# Patient Record
Sex: Female | Born: 1955
Health system: Southern US, Community
[De-identification: ages and names within clinical notes are randomized; demographics above are authoritative.]

## PROBLEM LIST (undated history)

## (undated) DIAGNOSIS — M1990 Unspecified osteoarthritis, unspecified site: Secondary | ICD-10-CM

## (undated) DIAGNOSIS — C801 Malignant (primary) neoplasm, unspecified: Secondary | ICD-10-CM

## (undated) DIAGNOSIS — I209 Angina pectoris, unspecified: Secondary | ICD-10-CM

## (undated) DIAGNOSIS — F419 Anxiety disorder, unspecified: Secondary | ICD-10-CM

## (undated) DIAGNOSIS — E079 Disorder of thyroid, unspecified: Secondary | ICD-10-CM

## (undated) HISTORY — DX: Unspecified osteoarthritis, unspecified site: M19.90

## (undated) HISTORY — PX: TONSILLECTOMY: SUR1361

## (undated) HISTORY — DX: Disorder of thyroid, unspecified: E07.9

## (undated) HISTORY — PX: DILATION AND CURETTAGE OF UTERUS: SHX78

---

## 1998-06-11 ENCOUNTER — Other Ambulatory Visit: Admission: RE | Admit: 1998-06-11 | Discharge: 1998-06-11 | Payer: Self-pay | Admitting: Obstetrics and Gynecology

## 2000-01-03 ENCOUNTER — Other Ambulatory Visit: Admission: RE | Admit: 2000-01-03 | Discharge: 2000-01-03 | Payer: Self-pay | Admitting: Obstetrics and Gynecology

## 2001-02-02 ENCOUNTER — Other Ambulatory Visit: Admission: RE | Admit: 2001-02-02 | Discharge: 2001-02-02 | Payer: Self-pay | Admitting: Obstetrics and Gynecology

## 2001-03-28 ENCOUNTER — Ambulatory Visit (HOSPITAL_COMMUNITY): Admission: RE | Admit: 2001-03-28 | Discharge: 2001-03-28 | Payer: Self-pay | Admitting: Obstetrics and Gynecology

## 2003-09-02 ENCOUNTER — Other Ambulatory Visit: Admission: RE | Admit: 2003-09-02 | Discharge: 2003-09-02 | Payer: Self-pay | Admitting: Obstetrics & Gynecology

## 2013-03-14 LAB — HM DIABETES EYE EXAM

## 2013-04-14 LAB — HM MAMMOGRAPHY

## 2013-05-23 ENCOUNTER — Encounter: Payer: Self-pay | Admitting: Internal Medicine

## 2013-05-23 ENCOUNTER — Ambulatory Visit (INDEPENDENT_AMBULATORY_CARE_PROVIDER_SITE_OTHER)
Admission: RE | Admit: 2013-05-23 | Discharge: 2013-05-23 | Disposition: A | Discharge status: Home / Self Care | Payer: BC Managed Care – PPO | Source: Ambulatory Visit | Attending: Internal Medicine | Admitting: Internal Medicine

## 2013-05-23 ENCOUNTER — Ambulatory Visit (INDEPENDENT_AMBULATORY_CARE_PROVIDER_SITE_OTHER): Payer: BC Managed Care – PPO | Admitting: Internal Medicine

## 2013-05-23 VITALS — BP 112/72 | HR 66 | Temp 98.5°F | Resp 16 | Ht 65.0 in

## 2013-05-23 DIAGNOSIS — M25561 Pain in right knee: Secondary | ICD-10-CM

## 2013-05-23 DIAGNOSIS — M179 Osteoarthritis of knee, unspecified: Secondary | ICD-10-CM

## 2013-05-23 DIAGNOSIS — M25569 Pain in unspecified knee: Secondary | ICD-10-CM

## 2013-05-23 DIAGNOSIS — M1710 Unilateral primary osteoarthritis, unspecified knee: Secondary | ICD-10-CM

## 2013-05-23 HISTORY — DX: Osteoarthritis of knee, unspecified: M17.9

## 2013-05-23 MED ORDER — NAPROXEN-ESOMEPRAZOLE 500-20 MG PO TBEC: 1.0000 | DELAYED_RELEASE_TABLET | Freq: Two times a day (BID) | ORAL | Status: DC

## 2013-05-23 NOTE — Assessment & Plan Note (Signed)
Start nsaids and PT Refer to ortho for treatment options regarding the calcific tendonitis of the MCL

## 2013-05-23 NOTE — Assessment & Plan Note (Signed)
Start Vimovo and begin PT

## 2013-05-23 NOTE — Patient Instructions (Signed)

## 2013-05-23 NOTE — Progress Notes (Signed)
  Subjective:    Patient ID: Toni Ramirez, female    DOB: 11-12-1956, 57 y.o.   MRN: [id number]  Arthritis Presents for initial visit. The disease course has been worsening. She complains of pain. She reports no stiffness, joint swelling or joint warmth. Affected locations include the right knee. Her pain is at a severity of 3/10. Pertinent negatives include no diarrhea, dry eyes, dry mouth, dysuria, fatigue, fever, pain at night, pain while resting, rash, Raynaud's syndrome, uveitis or weight loss. Her past medical history is significant for osteoarthritis. Her pertinent risk factors include overuse. Past treatments include NSAIDs. The treatment provided mild relief. Factors aggravating her arthritis include activity. Compliance with prior treatments has been good.      Review of Systems  Constitutional: Negative.  Negative for fever, weight loss and fatigue.  HENT: Negative.   Eyes: Negative.   Respiratory: Negative.   Cardiovascular: Negative.   Gastrointestinal: Negative.  Negative for diarrhea.  Endocrine: Negative.   Genitourinary: Negative.  Negative for dysuria.  Musculoskeletal: Positive for arthritis. Negative for myalgias, back pain, joint swelling, gait problem and stiffness.  Skin: Negative for rash.  Allergic/Immunologic: Negative.   Neurological: Negative.   Hematological: Negative.   Psychiatric/Behavioral: Negative.   All other systems reviewed and are negative.       Objective:   Physical Exam  Vitals reviewed. Constitutional: She is oriented to person, place, and time. She appears well-developed and well-nourished. No distress.  HENT:  Head: Normocephalic and atraumatic.  Mouth/Throat: Oropharynx is clear and moist. No oropharyngeal exudate.  Eyes: Conjunctivae are normal. Right eye exhibits no discharge. Left eye exhibits no discharge. No scleral icterus.  Neck: Normal range of motion. Neck supple. No JVD present. No tracheal deviation present. No thyromegaly  present.  Cardiovascular: Normal rate, regular rhythm, normal heart sounds and intact distal pulses.  Exam reveals no gallop and no friction rub.   No murmur heard. Pulmonary/Chest: Effort normal and breath sounds normal. No stridor. No respiratory distress. She has no wheezes. She has no rales. She exhibits no tenderness.  Abdominal: Soft. Bowel sounds are normal. She exhibits no distension and no mass. There is no tenderness. There is no rebound and no guarding.  Musculoskeletal: Normal range of motion. She exhibits no edema and no tenderness.       Right knee: She exhibits deformity (mild crepitance). She exhibits normal range of motion, no swelling, no effusion, no ecchymosis, no laceration, no erythema, normal alignment, no LCL laxity, normal patellar mobility and no bony tenderness. No tenderness found.       Left knee: Normal. She exhibits normal range of motion, no swelling, no effusion, no ecchymosis, no deformity, no laceration, no erythema, normal alignment, no LCL laxity, normal patellar mobility and no bony tenderness. No tenderness found.  Lymphadenopathy:    She has no cervical adenopathy.  Neurological: She is oriented to person, place, and time.  Skin: Skin is warm and dry. No rash noted. She is not diaphoretic. No erythema. No pallor.  Psychiatric: She has a normal mood and affect. Her behavior is normal. Judgment and thought content normal.     No results found for this basename: WBC, HGB, HCT, PLT, GLUCOSE, CHOL, TRIG, HDL, LDLDIRECT, LDLCALC, ALT, AST, NA, K, CL, CREATININE, BUN, CO2, TSH, PSA, INR, GLUF, HGBA1C, MICROALBUR       Assessment & Plan:

## 2013-09-19 ENCOUNTER — Other Ambulatory Visit: Payer: Self-pay

## 2013-11-13 ENCOUNTER — Other Ambulatory Visit (INDEPENDENT_AMBULATORY_CARE_PROVIDER_SITE_OTHER): Payer: BC Managed Care – PPO

## 2013-11-13 ENCOUNTER — Ambulatory Visit (INDEPENDENT_AMBULATORY_CARE_PROVIDER_SITE_OTHER): Payer: BC Managed Care – PPO | Admitting: Internal Medicine

## 2013-11-13 ENCOUNTER — Encounter: Payer: Self-pay | Admitting: Internal Medicine

## 2013-11-13 VITALS — BP 110/90 | HR 80 | Temp 98.9°F | Resp 16 | Wt 194.0 lb

## 2013-11-13 DIAGNOSIS — E049 Nontoxic goiter, unspecified: Secondary | ICD-10-CM | POA: Insufficient documentation

## 2013-11-13 DIAGNOSIS — J069 Acute upper respiratory infection, unspecified: Secondary | ICD-10-CM

## 2013-11-13 LAB — CBC WITH DIFFERENTIAL/PLATELET
Basophils Relative: 0.3 % (ref 0.0–3.0)
Eosinophils Relative: 3 % (ref 0.0–5.0)
HCT: 42.3 % (ref 36.0–46.0)
Lymphs Abs: 2.5 10*3/uL (ref 0.7–4.0)
MCHC: 33.4 g/dL (ref 30.0–36.0)
MCV: 85.9 fl (ref 78.0–100.0)
Monocytes Absolute: 0.7 10*3/uL (ref 0.1–1.0)
RBC: 4.92 Mil/uL (ref 3.87–5.11)
WBC: 7.4 10*3/uL (ref 4.5–10.5)

## 2013-11-13 LAB — BASIC METABOLIC PANEL
BUN: 16 mg/dL (ref 6–23)
Calcium: 9.4 mg/dL (ref 8.4–10.5)
Creatinine, Ser: 0.8 mg/dL (ref 0.4–1.2)
GFR: 80.49 mL/min (ref >60.00)
Glucose, Bld: 111 mg/dL — ABNORMAL HIGH (ref 70–99)

## 2013-11-13 LAB — HEPATIC FUNCTION PANEL
AST: 15 U/L (ref 0–37)
Bilirubin, Direct: 0.1 mg/dL (ref 0.0–0.3)
Total Bilirubin: 0.9 mg/dL (ref 0.3–1.2)

## 2013-11-13 MED ORDER — AZITHROMYCIN 250 MG PO TABS: ORAL_TABLET | ORAL | Status: DC

## 2013-11-13 NOTE — Progress Notes (Signed)
Pre visit review using our clinic review tool, if applicable. No additional management support is needed unless otherwise documented below in the visit note. 

## 2013-11-13 NOTE — Assessment & Plan Note (Signed)
12/14 R>>L [place] TSH, BMET, CBC

## 2013-11-13 NOTE — Progress Notes (Signed)
   Subjective:    Cough This is a new problem. The current episode started 1 to 4 weeks ago (2 wks). Associated symptoms include chills, a fever, nasal congestion and a sore throat. There is no history of asthma.      Review of Systems  Constitutional: Positive for fever and chills.  HENT: Positive for sore throat.   Respiratory: Positive for cough.   Gastrointestinal: Negative for diarrhea.       Objective:   Physical Exam  Constitutional: She appears well-developed. No distress.  HENT:  Head: Normocephalic.  Right Ear: External ear normal.  Left Ear: External ear normal.  Nose: Nose normal.  Mouth/Throat: Oropharynx is clear and moist.  Eyes: Conjunctivae are normal. Pupils are equal, round, and reactive to light. Right eye exhibits no discharge. Left eye exhibits no discharge.  Neck: Normal range of motion. Neck supple. No JVD present. No tracheal deviation present. Thyromegaly (R>>L) present.  Cardiovascular: Normal rate, regular rhythm and normal heart sounds.   Pulmonary/Chest: No stridor. No respiratory distress. She has no wheezes.  Abdominal: Soft. Bowel sounds are normal. She exhibits no distension and no mass. There is no tenderness. There is no rebound and no guarding.  Musculoskeletal: She exhibits no edema and no tenderness.  Lymphadenopathy:    She has no cervical adenopathy.  Neurological: She displays normal reflexes. No cranial nerve deficit. She exhibits normal muscle tone. Coordination normal.  Skin: No rash noted. No erythema.  Psychiatric: She has a normal mood and affect. Her behavior is normal. Judgment and thought content normal.          Assessment & Plan:

## 2013-11-14 ENCOUNTER — Encounter: Payer: Self-pay | Admitting: Internal Medicine

## 2013-11-14 NOTE — Assessment & Plan Note (Signed)
See meds 

## 2013-11-15 ENCOUNTER — Ambulatory Visit
Admission: RE | Admit: 2013-11-15 | Discharge: 2013-11-15 | Disposition: A | Discharge status: Home / Self Care | Payer: BC Managed Care – PPO | Source: Ambulatory Visit | Attending: Internal Medicine | Admitting: Internal Medicine

## 2013-11-15 LAB — TSH: TSH: 0.82 u[IU]/mL (ref 0.35–5.50)

## 2013-11-20 ENCOUNTER — Ambulatory Visit: Payer: BC Managed Care – PPO | Admitting: Internal Medicine

## 2013-11-20 ENCOUNTER — Other Ambulatory Visit (INDEPENDENT_AMBULATORY_CARE_PROVIDER_SITE_OTHER): Payer: BC Managed Care – PPO

## 2013-11-20 ENCOUNTER — Ambulatory Visit (INDEPENDENT_AMBULATORY_CARE_PROVIDER_SITE_OTHER): Payer: BC Managed Care – PPO | Admitting: Internal Medicine

## 2013-11-20 ENCOUNTER — Encounter: Payer: Self-pay | Admitting: Internal Medicine

## 2013-11-20 ENCOUNTER — Ambulatory Visit (INDEPENDENT_AMBULATORY_CARE_PROVIDER_SITE_OTHER)
Admission: RE | Admit: 2013-11-20 | Discharge: 2013-11-20 | Disposition: A | Discharge status: Home / Self Care | Payer: BC Managed Care – PPO | Source: Ambulatory Visit | Attending: Internal Medicine | Admitting: Internal Medicine

## 2013-11-20 VITALS — BP 126/74 | HR 87 | Temp 97.5°F | Resp 16 | Ht 65.0 in | Wt 194.0 lb

## 2013-11-20 DIAGNOSIS — R739 Hyperglycemia, unspecified: Secondary | ICD-10-CM | POA: Insufficient documentation

## 2013-11-20 DIAGNOSIS — R7309 Other abnormal glucose: Secondary | ICD-10-CM

## 2013-11-20 DIAGNOSIS — E049 Nontoxic goiter, unspecified: Secondary | ICD-10-CM

## 2013-11-20 DIAGNOSIS — R059 Cough, unspecified: Secondary | ICD-10-CM

## 2013-11-20 DIAGNOSIS — R05 Cough: Secondary | ICD-10-CM

## 2013-11-20 DIAGNOSIS — E079 Disorder of thyroid, unspecified: Secondary | ICD-10-CM

## 2013-11-20 LAB — BASIC METABOLIC PANEL
BUN: 16 mg/dL (ref 6–23)
CALCIUM: 9.6 mg/dL (ref 8.4–10.5)
CO2: 26 mEq/L (ref 19–32)
Chloride: 104 mEq/L (ref 96–112)
Creatinine, Ser: 0.9 mg/dL (ref 0.4–1.2)
GFR: 70.02 mL/min (ref >60.00)
Glucose, Bld: 104 mg/dL — ABNORMAL HIGH (ref 70–99)
POTASSIUM: 4 meq/L (ref 3.5–5.1)
SODIUM: 139 meq/L (ref 135–145)

## 2013-11-20 LAB — HEMOGLOBIN A1C: HEMOGLOBIN A1C: 5.6 % (ref 4.6–6.5)

## 2013-11-20 LAB — T3, FREE: T3 FREE: 3 pg/mL (ref 2.3–4.2)

## 2013-11-20 MED ORDER — MOMETASONE FUROATE 220 MCG/INH IN AEPB: 2.0000 | INHALATION_SPRAY | Freq: Every day | RESPIRATORY_TRACT | Status: DC

## 2013-11-20 MED ORDER — HYDROCOD POLST-CHLORPHEN POLST 10-8 MG/5ML PO LQCR: 5.0000 mL | Freq: Two times a day (BID) | ORAL | Status: DC | PRN

## 2013-11-20 NOTE — Assessment & Plan Note (Signed)
Labs today to check her TFT's and to look for Grave's disease

## 2013-11-20 NOTE — Progress Notes (Signed)
Subjective:    Patient ID: Toni Ramirez, female    DOB: February 15, 1956, 58 y.o.   MRN: [id number]  Cough This is a recurrent problem. The current episode started 1 to 4 weeks ago. The problem has been gradually improving. The problem occurs every few hours. The cough is non-productive. Pertinent negatives include no chest pain, chills, ear congestion, ear pain, fever, headaches, heartburn, hemoptysis, myalgias, nasal congestion, postnasal drip, rash, rhinorrhea, sore throat, shortness of breath, sweats, weight loss or wheezing. The symptoms are aggravated by cold air. She has tried OTC cough suppressant (zpak) for the symptoms. The treatment provided mild relief. Her past medical history is significant for bronchitis. There is no history of asthma, bronchiectasis, COPD, emphysema, environmental allergies or pneumonia.      Review of Systems  Constitutional: Negative.  Negative for fever, chills, weight loss, diaphoresis, appetite change and fatigue.  HENT: Negative.  Negative for ear pain, postnasal drip, rhinorrhea, sore throat, tinnitus, trouble swallowing and voice change.   Eyes: Negative.   Respiratory: Positive for cough. Negative for apnea, hemoptysis, choking, chest tightness, shortness of breath, wheezing and stridor.   Cardiovascular: Negative.  Negative for chest pain, palpitations and leg swelling.  Gastrointestinal: Negative.  Negative for heartburn, nausea, vomiting, abdominal pain, diarrhea, constipation and blood in stool.  Endocrine: Negative.   Genitourinary: Negative.   Musculoskeletal: Negative.  Negative for myalgias, neck pain and neck stiffness.  Skin: Negative.  Negative for rash.  Allergic/Immunologic: Negative.  Negative for environmental allergies.  Neurological: Negative for headaches.  Hematological: Negative.  Negative for adenopathy. Does not bruise/bleed easily.  Psychiatric/Behavioral: Negative.        Objective:   Physical Exam  Vitals  reviewed. Constitutional: She is oriented to person, place, and time. She appears well-developed and well-nourished.  Non-toxic appearance. She does not have a sickly appearance. She does not appear ill. No distress.  HENT:  Head: Normocephalic and atraumatic.  Mouth/Throat: Oropharynx is clear and moist. No oropharyngeal exudate.  Eyes: Conjunctivae are normal. Right eye exhibits no discharge. Left eye exhibits no discharge. No scleral icterus.  Neck: Normal range of motion. Neck supple. No tracheal tenderness present. No tracheal deviation present. Mass and thyromegaly present.  Cardiovascular: Normal rate, regular rhythm, normal heart sounds and intact distal pulses.  Exam reveals no gallop and no friction rub.   No murmur heard. Pulmonary/Chest: Effort normal and breath sounds normal. No accessory muscle usage or stridor. Not tachypneic. No respiratory distress. She has no decreased breath sounds. She has no wheezes. She has no rhonchi. She has no rales. She exhibits no tenderness.  Abdominal: Soft. Bowel sounds are normal. She exhibits no distension and no mass. There is no tenderness. There is no rebound and no guarding.  Musculoskeletal: Normal range of motion. She exhibits no edema and no tenderness.  Lymphadenopathy:    She has no cervical adenopathy.  Neurological: She is oriented to person, place, and time.  Skin: Skin is warm and dry. No rash noted. She is not diaphoretic. No erythema. No pallor.  Psychiatric: She has a normal mood and affect. Her behavior is normal. Judgment and thought content normal.     Lab Results  Component Value Date   WBC 7.4 11/13/2013   HGB 14.1 11/13/2013   HCT 42.3 11/13/2013   PLT 355.0 11/13/2013   GLUCOSE 111* 11/13/2013   ALT 16 11/13/2013   AST 15 11/13/2013   NA 138 11/13/2013   K 4.1 11/13/2013   CL 106  11/13/2013   CREATININE 0.8 11/13/2013   BUN 16 11/13/2013   CO2 27 11/13/2013   TSH 0.82 11/13/2013       Assessment & Plan:

## 2013-11-20 NOTE — Assessment & Plan Note (Signed)
I will check her CXR to see if there is PNA, mass, edema, etc I think she has a persistent post-viral URI cough so I have asked her to start using asmanex inh and to take tussionex susp as needed for the cough

## 2013-11-20 NOTE — Progress Notes (Signed)
Pre visit review using our clinic review tool, if applicable. No additional management support is needed unless otherwise documented below in the visit note. 

## 2013-11-20 NOTE — Assessment & Plan Note (Signed)
She will see ENT to consider surgery or biopsy Today I will check her labs to look for hyper-hypothyroidism, grave's disease, and cancer

## 2013-11-20 NOTE — Assessment & Plan Note (Signed)
I will check her A1C to see if she has developed DM2 

## 2013-11-20 NOTE — Patient Instructions (Signed)

## 2013-11-21 ENCOUNTER — Encounter: Payer: Self-pay | Admitting: Internal Medicine

## 2013-11-21 LAB — THYROID PEROXIDASE ANTIBODY

## 2013-11-21 LAB — T4: T4 TOTAL: 7.4 ug/dL (ref 5.0–12.5)

## 2013-11-21 LAB — THYROGLOBULIN ANTIBODY: Thyroglobulin Ab: <20 U/mL (ref <40.0)

## 2013-11-22 ENCOUNTER — Other Ambulatory Visit (INDEPENDENT_AMBULATORY_CARE_PROVIDER_SITE_OTHER): Payer: Self-pay

## 2013-11-22 ENCOUNTER — Ambulatory Visit (INDEPENDENT_AMBULATORY_CARE_PROVIDER_SITE_OTHER): Payer: BC Managed Care – PPO | Admitting: Surgery

## 2013-11-22 ENCOUNTER — Encounter (INDEPENDENT_AMBULATORY_CARE_PROVIDER_SITE_OTHER): Payer: Self-pay | Admitting: Surgery

## 2013-11-22 VITALS — BP 132/86 | HR 78 | Resp 18 | Ht 65.75 in | Wt 195.0 lb

## 2013-11-22 DIAGNOSIS — E079 Disorder of thyroid, unspecified: Secondary | ICD-10-CM

## 2013-11-22 NOTE — Progress Notes (Addendum)
Re:   Toni Ramirez DOB:   Jun 15, 1956 MRN:   [id number]  ASSESSMENT AND PLAN: 1.  Thyroid mass, right lobe  7.4 cm on [place]  Will plan needle biopsy of right thyroid mass.  Because of size of the mass, will almost certainly need to remove it.  I gave her literature on thyroid diseases.  I discussed briefly surgery, the indications and risks.  Risks include, but are not limited to bleeding, infection, nerve injury (particularly the recurrent laryngeal nerve), and parathyroid injury. 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  Discussed with patient about going ahead with a right thyroid lobectomy.  DN 12/01/2013]  2.  DJD of knee - doing fairly well. 3.  Has chest cold/bronchitis for about 3 weeks - no fever  Chief Complaint  Patient presents with  . New Evaluation    thy mass   REFERRING PHYSICIAN: Scarlette Calico, MD  HISTORY OF PRESENT ILLNESS: Toni Ramirez is a 58 y.o. (DOB: 11-Mar-1956)  white  female whose primary care physician is Toni Calico, MD and comes to me today for right thyroid mass.  She did not notice anything, but went to Dr. Ronnald Ramp over the holidays for a persistant cold.  He noticed a right thyroid mass and started a work up of the mass.  She has had no prior head or neck irradiation.  She has no family history of thyroid disease.  She's had no prior neck surgery.  [place] - 11/13/2013 - Thyromegaly with dominant 7.4 cm right mass. Findings meet consensus criteria for biopsy. TSH - 0.82 0 11/13/2013   Past Medical History  Diagnosis Date  . Arthritis     Past Surgical History  Procedure Laterality Date  . Tonsillectomy    . Dilation and curettage of uterus  1994, 2003   Current Outpatient Prescriptions  Medication Sig Dispense Refill  . chlorpheniramine-HYDROcodone (TUSSIONEX PENNKINETIC ER) 10-8 MG/5ML LQCR Take 5 mLs by mouth every 12 (twelve) hours as needed for cough.  115 mL  0  . mometasone (ASMANEX 60  METERED DOSES) 220 MCG/INH inhaler Inhale 2 puffs into the lungs daily.  1 Inhaler  0  . Naproxen-Esomeprazole (VIMOVO) 500-20 MG TBEC Take 1 tablet by mouth 2 (two) times daily.  180 tablet  1   No current facility-administered medications for this visit.     No Known Allergies  REVIEW OF SYSTEMS: Skin:  No history of rash.  No history of abnormal moles. Infection:  No history of hepatitis or HIV.  No history of MRSA. Neurologic:  No history of stroke.  No history of headaches. Cardiac:  No history of hypertension. No history of heart disease.   No history of seeing a cardiologist. Pulmonary:  Has 2-3 weeks cold/bronchitis.  Endocrine:  No diabetes. No thyroid disease. Gastrointestinal:  No history of stomach disease.  No history of liver disease.  No history of gall bladder disease.  No history of pancreas disease.  Colonoscopy by Dr. Collene Mares - 2010. Urologic:  No history of kidney stones.  No history of bladder infections. Musculoskeletal:  No history of joint or back disease. Hematologic:  No bleeding disorder.  No history of anemia.  Not anticoagulated. Psycho-social:  The patient is oriented.   The patient has no obvious psychologic or social impairment to understanding our conversation and plan.  SOCIAL and FAMILY HISTORY: Husband died of a MI about 3 years ago. She has 3 children:  2 boys -  25 and 21.  And daughter, Toni Ramirez, 17, a junior at [employer]. 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  She worked with [employer]uss at one time. She was a [employer] at the same time I was.  Her maiden name was Glendon Jackson.  She was 1 year behind me in school and went to [employer].  She moved away her senior year.  She mentioned [employer] and Cathy Lee.  PHYSICAL EXAM: BP 132/86  Pulse 78  Resp 18  Ht 5' 5.75" (1.67 m)  Wt 195 lb (88.451 kg)  BMI 31.72 kg/m2  LMP 06/14/2010  General: WN WF who is alert and generally healthy appearing.  HEENT: Normal. Pupils equal. Neck:  Supple. No mass.  Visible fullness in right neck.  She has a soft, approx 6 cm mass, in the right thyroid.   Lymph Nodes:  No supraclavicular or cervical nodes. Lungs: Clear to auscultation and symmetric breath sounds. Heart:  RRR. No murmur or rub. Extremities:  Good strength and ROM  in upper and lower extremities. Neurologic:  Grossly intact to motor and sensory function. Psychiatric: Has normal mood and affect. Behavior is normal.   DATA REVIEWED: Notes in Epic and I gave her copies of her US.  Though she had already seen it on My Chart.  Ashish Rossetti, MD,  F[facility] [address]  [address]   [place], [place]    [address] Phone:  [phone] FAX:  [phone]  

## 2013-11-27 ENCOUNTER — Ambulatory Visit
Admission: RE | Admit: 2013-11-27 | Discharge: 2013-11-27 | Disposition: A | Discharge status: Home / Self Care | Payer: BC Managed Care – PPO | Source: Ambulatory Visit | Attending: Surgery | Admitting: Surgery

## 2013-11-27 ENCOUNTER — Other Ambulatory Visit (HOSPITAL_COMMUNITY)
Admission: RE | Admit: 2013-11-27 | Discharge: 2013-11-27 | Disposition: A | Discharge status: Home / Self Care | Payer: BC Managed Care – PPO | Source: Ambulatory Visit | Attending: Interventional Radiology | Admitting: Interventional Radiology

## 2013-11-27 DIAGNOSIS — E041 Nontoxic single thyroid nodule: Secondary | ICD-10-CM | POA: Insufficient documentation

## 2013-11-27 DIAGNOSIS — E079 Disorder of thyroid, unspecified: Secondary | ICD-10-CM

## 2013-12-02 ENCOUNTER — Other Ambulatory Visit (INDEPENDENT_AMBULATORY_CARE_PROVIDER_SITE_OTHER): Payer: Self-pay | Admitting: Surgery

## 2013-12-04 ENCOUNTER — Ambulatory Visit: Payer: BC Managed Care – PPO | Admitting: Internal Medicine

## 2013-12-05 ENCOUNTER — Telehealth (INDEPENDENT_AMBULATORY_CARE_PROVIDER_SITE_OTHER): Payer: Self-pay

## 2013-12-05 NOTE — Telephone Encounter (Signed)
V/M appt w/DR. Newman 01-08-14 10am

## 2013-12-12 ENCOUNTER — Encounter (HOSPITAL_COMMUNITY): Payer: Self-pay | Admitting: Pharmacy Technician

## 2013-12-13 ENCOUNTER — Other Ambulatory Visit (HOSPITAL_COMMUNITY): Payer: Self-pay | Admitting: Surgery

## 2013-12-13 NOTE — Patient Instructions (Addendum)
      Your procedure is scheduled on:  12/20/13 FRIDAY  Report to [facility] at  1000     AM.  Call this number if you have problems the morning of surgery: [phone]       Remember: NO CHILDREN UNDER THE AGE OF 18 WILL BE ALLOWED TO VISIT DURING FLU SEASON AS PER [place]   Do not eat food  Or drink :After Midnight. Thursday NIGHT   Take these medicines the morning of surgery with A SIP OF WATER:NONE except may use inhaler if needed.  [place]   Contacts, dentures or partial plates, or metal hairpins  can not be worn to surgery. Your family will be responsible for glasses, dentures, hearing aides while you are in surgery  Leave suitcase in the car. After surgery it may be brought to your room.  For patients admitted to the hospital, checkout time is 11:00 AM day of  discharge.              DO NOT WEAR JEWELRY, LOTIONS, POWDERS, OR PERFUMES.  WOMEN-- DO NOT SHAVE LEGS OR UNDERARMS FOR 48 HOURS BEFORE SHOWERS. MEN MAY SHAVE FACE.                                                                       Please read over the following fact sheets that you were given: MRSA Information, Incentive Spirometry Sheet, Blood Transfusion Sheet  Information    Zyion Doxtater  PST 336  [phone]                 [place]   OF YOUR SURGERY                                                  Patient Signature _____________________________

## 2013-12-16 ENCOUNTER — Encounter (HOSPITAL_COMMUNITY)
Admission: RE | Admit: 2013-12-16 | Discharge: 2013-12-16 | Disposition: A | Discharge status: Home / Self Care | Payer: BC Managed Care – PPO | Source: Ambulatory Visit | Attending: Surgery | Admitting: Surgery

## 2013-12-16 ENCOUNTER — Encounter (HOSPITAL_COMMUNITY): Payer: Self-pay

## 2013-12-16 DIAGNOSIS — Z01812 Encounter for preprocedural laboratory examination: Secondary | ICD-10-CM | POA: Insufficient documentation

## 2013-12-16 DIAGNOSIS — Z01818 Encounter for other preprocedural examination: Secondary | ICD-10-CM | POA: Insufficient documentation

## 2013-12-16 HISTORY — DX: Anxiety disorder, unspecified: F41.9

## 2013-12-16 HISTORY — DX: Angina pectoris, unspecified: I20.9

## 2013-12-16 LAB — CBC
HEMATOCRIT: 41.4 % (ref 36.0–46.0)
HEMOGLOBIN: 14.1 g/dL (ref 12.0–15.0)
MCH: 29.4 pg (ref 26.0–34.0)
MCHC: 34.1 g/dL (ref 30.0–36.0)
MCV: 86.4 fL (ref 78.0–100.0)
Platelets: 302 10*3/uL (ref 150–400)
RBC: 4.79 MIL/uL (ref 3.87–5.11)
RDW: 13.1 % (ref 11.5–15.5)
WBC: 6.5 10*3/uL (ref 4.0–10.5)

## 2013-12-16 LAB — BASIC METABOLIC PANEL
BUN: 18 mg/dL (ref 6–23)
CHLORIDE: 104 meq/L (ref 96–112)
CO2: 25 mEq/L (ref 19–32)
Calcium: 9.6 mg/dL (ref 8.4–10.5)
Creatinine, Ser: 0.8 mg/dL (ref 0.50–1.10)
GFR calc Af Amer: >90 mL/min (ref >90)
GFR calc non Af Amer: 80 mL/min — ABNORMAL LOW (ref >90)
Glucose, Bld: 108 mg/dL — ABNORMAL HIGH (ref 70–99)
Potassium: 4.5 mEq/L (ref 3.7–5.3)
Sodium: 140 mEq/L (ref 137–147)

## 2013-12-16 NOTE — Progress Notes (Signed)
Chest x-ray 11/20/13 on EPIC

## 2013-12-20 ENCOUNTER — Encounter (HOSPITAL_COMMUNITY)
Admission: RE | Disposition: A | Discharge status: Home / Self Care | Payer: Self-pay | Source: Ambulatory Visit | Attending: Surgery

## 2013-12-20 ENCOUNTER — Encounter (HOSPITAL_COMMUNITY): Payer: BC Managed Care – PPO | Admitting: Anesthesiology

## 2013-12-20 ENCOUNTER — Ambulatory Visit (HOSPITAL_COMMUNITY)
Admission: RE | Admit: 2013-12-20 | Discharge: 2013-12-21 | Disposition: A | Discharge status: Home / Self Care | Payer: BC Managed Care – PPO | Source: Ambulatory Visit | Attending: Surgery | Admitting: Surgery

## 2013-12-20 ENCOUNTER — Encounter (HOSPITAL_COMMUNITY): Payer: Self-pay | Admitting: *Deleted

## 2013-12-20 ENCOUNTER — Ambulatory Visit (HOSPITAL_COMMUNITY): Payer: BC Managed Care – PPO | Admitting: Anesthesiology

## 2013-12-20 DIAGNOSIS — Z79899 Other long term (current) drug therapy: Secondary | ICD-10-CM | POA: Insufficient documentation

## 2013-12-20 DIAGNOSIS — C73 Malignant neoplasm of thyroid gland: Secondary | ICD-10-CM

## 2013-12-20 DIAGNOSIS — M129 Arthropathy, unspecified: Secondary | ICD-10-CM | POA: Insufficient documentation

## 2013-12-20 DIAGNOSIS — E079 Disorder of thyroid, unspecified: Secondary | ICD-10-CM | POA: Diagnosis present

## 2013-12-20 DIAGNOSIS — M1710 Unilateral primary osteoarthritis, unspecified knee: Secondary | ICD-10-CM | POA: Insufficient documentation

## 2013-12-20 HISTORY — PX: THYROID LOBECTOMY: SHX420

## 2013-12-20 SURGERY — LOBECTOMY, THYROID
Anesthesia: General | Site: Neck | Laterality: Right

## 2013-12-20 MED ORDER — CISATRACURIUM BESYLATE 20 MG/10ML IV SOLN
INTRAVENOUS | Status: AC
  Filled 2013-12-20: qty 10

## 2013-12-20 MED ORDER — FENTANYL CITRATE 0.05 MG/ML IJ SOLN
INTRAMUSCULAR | Status: DC | PRN
  Administered 2013-12-20: 100 ug via INTRAVENOUS
  Administered 2013-12-20: 50 ug via INTRAVENOUS

## 2013-12-20 MED ORDER — ONDANSETRON HCL 4 MG/2ML IJ SOLN
INTRAMUSCULAR | Status: AC
  Filled 2013-12-20: qty 2

## 2013-12-20 MED ORDER — GLYCOPYRROLATE 0.2 MG/ML IJ SOLN
INTRAMUSCULAR | Status: AC
  Filled 2013-12-20: qty 3

## 2013-12-20 MED ORDER — ONDANSETRON HCL 4 MG/2ML IJ SOLN
4.0000 mg | Freq: Four times a day (QID) | INTRAMUSCULAR | Status: DC | PRN
  Administered 2013-12-20: 4 mg via INTRAVENOUS

## 2013-12-20 MED ORDER — LACTATED RINGERS IV SOLN
INTRAVENOUS | Status: DC
  Administered 2013-12-20: 11:00:00 via INTRAVENOUS

## 2013-12-20 MED ORDER — KCL IN DEXTROSE-NACL 20-5-0.45 MEQ/L-%-% IV SOLN
INTRAVENOUS | Status: DC
  Administered 2013-12-20 – 2013-12-21 (×2): via INTRAVENOUS
  Filled 2013-12-20 (×3): qty 1000

## 2013-12-20 MED ORDER — FENTANYL CITRATE 0.05 MG/ML IJ SOLN
INTRAMUSCULAR | Status: AC
  Filled 2013-12-20: qty 2

## 2013-12-20 MED ORDER — MEPERIDINE HCL 50 MG/ML IJ SOLN: 6.2500 mg | INTRAMUSCULAR | Status: DC | PRN

## 2013-12-20 MED ORDER — CISATRACURIUM BESYLATE (PF) 10 MG/5ML IV SOLN
INTRAVENOUS | Status: DC | PRN
  Administered 2013-12-20: 10 mg via INTRAVENOUS
  Administered 2013-12-20: 2 mg via INTRAVENOUS

## 2013-12-20 MED ORDER — PROPOFOL 10 MG/ML IV BOLUS
INTRAVENOUS | Status: DC | PRN
  Administered 2013-12-20: 200 mg via INTRAVENOUS

## 2013-12-20 MED ORDER — PROMETHAZINE HCL 25 MG PO TABS: 12.5000 mg | ORAL_TABLET | ORAL | Status: DC | PRN

## 2013-12-20 MED ORDER — PROPOFOL 10 MG/ML IV BOLUS
INTRAVENOUS | Status: AC
  Filled 2013-12-20: qty 20

## 2013-12-20 MED ORDER — MORPHINE SULFATE 4 MG/ML IJ SOLN
INTRAMUSCULAR | Status: AC
  Filled 2013-12-20: qty 1

## 2013-12-20 MED ORDER — BUPIVACAINE HCL 0.25 % IJ SOLN
INTRAMUSCULAR | Status: DC | PRN
  Administered 2013-12-20: 10 mL

## 2013-12-20 MED ORDER — MORPHINE SULFATE 2 MG/ML IJ SOLN
1.0000 mg | INTRAMUSCULAR | Status: DC | PRN
  Administered 2013-12-20: 2 mg via INTRAVENOUS
  Administered 2013-12-20: 4 mg via INTRAVENOUS
  Administered 2013-12-20: 2 mg via INTRAVENOUS
  Filled 2013-12-20: qty 2
  Filled 2013-12-20: qty 1

## 2013-12-20 MED ORDER — GLYCOPYRROLATE 0.2 MG/ML IJ SOLN
INTRAMUSCULAR | Status: DC | PRN
  Administered 2013-12-20: 0.6 mg via INTRAVENOUS

## 2013-12-20 MED ORDER — DEXAMETHASONE SODIUM PHOSPHATE 10 MG/ML IJ SOLN
INTRAMUSCULAR | Status: AC
  Filled 2013-12-20: qty 1

## 2013-12-20 MED ORDER — PHENYLEPHRINE HCL 10 MG/ML IJ SOLN
INTRAMUSCULAR | Status: DC | PRN
  Administered 2013-12-20 (×2): 60 ug via INTRAVENOUS

## 2013-12-20 MED ORDER — LACTATED RINGERS IV SOLN: INTRAVENOUS | Status: DC

## 2013-12-20 MED ORDER — FENTANYL CITRATE 0.05 MG/ML IJ SOLN
INTRAMUSCULAR | Status: AC
  Filled 2013-12-20: qty 5

## 2013-12-20 MED ORDER — PROMETHAZINE HCL 25 MG/ML IJ SOLN
12.5000 mg | INTRAMUSCULAR | Status: DC | PRN
  Administered 2013-12-20: 12.5 mg via INTRAVENOUS
  Filled 2013-12-20: qty 1

## 2013-12-20 MED ORDER — FENTANYL CITRATE 0.05 MG/ML IJ SOLN
25.0000 ug | INTRAMUSCULAR | Status: DC | PRN
  Administered 2013-12-20 (×2): 50 ug via INTRAVENOUS

## 2013-12-20 MED ORDER — PROMETHAZINE HCL 25 MG/ML IJ SOLN: 6.2500 mg | INTRAMUSCULAR | Status: DC | PRN

## 2013-12-20 MED ORDER — HYDROCODONE-ACETAMINOPHEN 5-325 MG PO TABS
1.0000 | ORAL_TABLET | ORAL | Status: DC | PRN
  Administered 2013-12-20 – 2013-12-21 (×2): 1 via ORAL
  Administered 2013-12-21: 2 via ORAL
  Filled 2013-12-20: qty 2
  Filled 2013-12-20 (×2): qty 1

## 2013-12-20 MED ORDER — MIDAZOLAM HCL 5 MG/5ML IJ SOLN
INTRAMUSCULAR | Status: DC | PRN
  Administered 2013-12-20: 2 mg via INTRAVENOUS

## 2013-12-20 MED ORDER — IBUPROFEN 600 MG PO TABS
600.0000 mg | ORAL_TABLET | Freq: Four times a day (QID) | ORAL | Status: DC | PRN
  Filled 2013-12-20: qty 1

## 2013-12-20 MED ORDER — ONDANSETRON HCL 4 MG/2ML IJ SOLN
INTRAMUSCULAR | Status: DC | PRN
  Administered 2013-12-20: 4 mg via INTRAVENOUS

## 2013-12-20 MED ORDER — BUPIVACAINE HCL (PF) 0.25 % IJ SOLN
INTRAMUSCULAR | Status: AC
  Filled 2013-12-20: qty 30

## 2013-12-20 MED ORDER — ONDANSETRON HCL 4 MG PO TABS: 4.0000 mg | ORAL_TABLET | Freq: Four times a day (QID) | ORAL | Status: DC | PRN

## 2013-12-20 MED ORDER — LACTATED RINGERS IV SOLN
INTRAVENOUS | Status: DC | PRN
  Administered 2013-12-20 (×2): via INTRAVENOUS

## 2013-12-20 MED ORDER — NEOSTIGMINE METHYLSULFATE 1 MG/ML IJ SOLN
INTRAMUSCULAR | Status: DC | PRN
  Administered 2013-12-20: 4 mg via INTRAVENOUS

## 2013-12-20 MED ORDER — PHENYLEPHRINE 40 MCG/ML (10ML) SYRINGE FOR IV PUSH (FOR BLOOD PRESSURE SUPPORT)
PREFILLED_SYRINGE | INTRAVENOUS | Status: AC
  Filled 2013-12-20: qty 10

## 2013-12-20 MED ORDER — MIDAZOLAM HCL 2 MG/2ML IJ SOLN
INTRAMUSCULAR | Status: AC
  Filled 2013-12-20: qty 2

## 2013-12-20 MED ORDER — 0.9 % SODIUM CHLORIDE (POUR BTL) OPTIME
TOPICAL | Status: DC | PRN
  Administered 2013-12-20: 1000 mL

## 2013-12-20 MED ORDER — DEXAMETHASONE SODIUM PHOSPHATE 10 MG/ML IJ SOLN
INTRAMUSCULAR | Status: DC | PRN
  Administered 2013-12-20: 10 mg via INTRAVENOUS

## 2013-12-20 MED ORDER — HEMOSTATIC AGENTS (NO CHARGE) OPTIME
TOPICAL | Status: DC | PRN
  Administered 2013-12-20: 1 via TOPICAL

## 2013-12-20 SURGICAL SUPPLY — 45 items
ATTRACTOMAT 16X20 MAGNETIC DRP (DRAPES) ×3 IMPLANT
BENZOIN TINCTURE PRP APPL 2/3 (GAUZE/BANDAGES/DRESSINGS) ×3 IMPLANT
BLADE HEX COATED 2.75 (ELECTRODE) ×3 IMPLANT
BLADE SURG 15 STRL LF DISP TIS (BLADE) ×2 IMPLANT
BLADE SURG 15 STRL SS (BLADE) ×4
CANISTER SUCTION 2500CC (MISCELLANEOUS) ×3 IMPLANT
CLIP TI MEDIUM 6 (CLIP) ×9 IMPLANT
CLIP TI WIDE RED SMALL 6 (CLIP) ×6 IMPLANT
CLOSURE WOUND 1/2 X4 (GAUZE/BANDAGES/DRESSINGS) ×1
DISSECTOR ROUND CHERRY 3/8 STR (MISCELLANEOUS) ×3 IMPLANT
DRAIN PENROSE 18X1/4 LTX STRL (WOUND CARE) IMPLANT
DRAPE PED LAPAROTOMY (DRAPES) ×3 IMPLANT
ELECT COATED BLADE 2.86 ST (ELECTRODE) IMPLANT
ELECT REM PT RETURN 9FT ADLT (ELECTROSURGICAL) ×3
ELECTRODE REM PT RTRN 9FT ADLT (ELECTROSURGICAL) ×1 IMPLANT
GAUZE SPONGE 4X4 16PLY XRAY LF (GAUZE/BANDAGES/DRESSINGS) ×6 IMPLANT
GLOVE BIOGEL PI IND STRL 7.0 (GLOVE) ×1 IMPLANT
GLOVE BIOGEL PI INDICATOR 7.0 (GLOVE) ×2
GLOVE SURG SIGNA 7.5 PF LTX (GLOVE) ×3 IMPLANT
GOWN STRL REUS W/TWL LRG LVL3 (GOWN DISPOSABLE) ×3 IMPLANT
GOWN STRL REUS W/TWL XL LVL3 (GOWN DISPOSABLE) ×6 IMPLANT
HEMOSTAT SURGICEL 2X14 (HEMOSTASIS) IMPLANT
HEMOSTAT SURGICEL 2X4 FIBR (HEMOSTASIS) ×3 IMPLANT
KIT BASIN OR (CUSTOM PROCEDURE TRAY) ×3 IMPLANT
NS IRRIG 1000ML POUR BTL (IV SOLUTION) ×3 IMPLANT
PACK BASIC VI WITH GOWN DISP (CUSTOM PROCEDURE TRAY) ×3 IMPLANT
PENCIL BUTTON HOLSTER BLD 10FT (ELECTRODE) ×3 IMPLANT
SPONGE GAUZE 4X4 12PLY (GAUZE/BANDAGES/DRESSINGS) ×3 IMPLANT
STAPLER VISISTAT 35W (STAPLE) ×3 IMPLANT
STRIP CLOSURE SKIN 1/2X4 (GAUZE/BANDAGES/DRESSINGS) ×2 IMPLANT
SUT MON AB 5-0 PS2 18 (SUTURE) ×3 IMPLANT
SUT SILK 2 0 (SUTURE)
SUT SILK 2-0 18XBRD TIE 12 (SUTURE) IMPLANT
SUT SILK 3 0 (SUTURE)
SUT SILK 3 0 SH CR/8 (SUTURE) ×6 IMPLANT
SUT SILK 3-0 18XBRD TIE 12 (SUTURE) IMPLANT
SUT VIC AB 0 CT1 36 (SUTURE) IMPLANT
SUT VIC AB 3-0 SH 18 (SUTURE) ×3 IMPLANT
SUT VIC AB 5-0 PS2 18 (SUTURE) ×3 IMPLANT
SYR BULB IRRIGATION 50ML (SYRINGE) ×6 IMPLANT
SYR CONTROL 10ML LL (SYRINGE) ×3 IMPLANT
TAPE CLOTH SURG 4X10 WHT LF (GAUZE/BANDAGES/DRESSINGS) ×3 IMPLANT
TOWEL OR 17X26 10 PK STRL BLUE (TOWEL DISPOSABLE) ×3 IMPLANT
WATER STERILE IRR 1500ML POUR (IV SOLUTION) IMPLANT
YANKAUER SUCT BULB TIP 10FT TU (MISCELLANEOUS) ×3 IMPLANT

## 2013-12-20 NOTE — Anesthesia Postprocedure Evaluation (Signed)
  Anesthesia Post-op Note  Patient: Toni Ramirez  Procedure(s) Performed: Procedure(s) (LRB): RIGHT THYROID LOBECTOMY (Right)  Patient Location: PACU  Anesthesia Type: General  Level of Consciousness: awake and alert   Airway and Oxygen Therapy: Patient Spontanous Breathing  Post-op Pain: mild  Post-op Assessment: Post-op Vital signs reviewed, Patient's Cardiovascular Status Stable, Respiratory Function Stable, Patent Airway and No signs of Nausea or vomiting  Last Vitals:  Filed Vitals:   12/20/13 1600  BP: 124/86  Pulse: 77  Temp: 36.6 C  Resp: 16    Post-op Vital Signs: stable   Complications: No apparent anesthesia complications

## 2013-12-20 NOTE — Anesthesia Preprocedure Evaluation (Signed)

## 2013-12-20 NOTE — Interval H&P Note (Signed)
History and Physical Interval Note:  12/20/2013 11:18 AM  Toni Ramirez  has presented today for surgery, with the diagnosis of FOLLICULAR LESION OF RIGHT THYROID LOBE  The various methods of treatment have been discussed with the patient and family. Sister, Zigmund Daniel, with patient.  After consideration of risks, benefits and other options for treatment, the patient has consented to  Procedure(s): RIGHT THYROID LOBECTOMY (Right) as a surgical intervention .  The patient's history has been reviewed, patient examined, no change in status, stable for surgery.  I have reviewed the patient's chart and labs.  Questions were answered to the patient's satisfaction.     [name] H

## 2013-12-20 NOTE — H&P (View-Only) (Signed)
Re:   Toni Ramirez DOB:   Jun 15, 1956 MRN:   [id number]  ASSESSMENT AND PLAN: 1.  Thyroid mass, right lobe  7.4 cm on [place]  Will plan needle biopsy of right thyroid mass.  Because of size of the mass, will almost certainly need to remove it.  I gave her literature on thyroid diseases.  I discussed briefly surgery, the indications and risks.  Risks include, but are not limited to bleeding, infection, nerve injury (particularly the recurrent laryngeal nerve), and parathyroid injury. 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  Discussed with patient about going ahead with a right thyroid lobectomy.  DN 12/01/2013]  2.  DJD of knee - doing fairly well. 3.  Has chest cold/bronchitis for about 3 weeks - no fever  Chief Complaint  Patient presents with  . New Evaluation    thy mass   REFERRING PHYSICIAN: Scarlette Calico, MD  HISTORY OF PRESENT ILLNESS: Toni Ramirez is a 58 y.o. (DOB: 11-Mar-1956)  white  female whose primary care physician is Scarlette Calico, MD and comes to me today for right thyroid mass.  She did not notice anything, but went to Dr. Ronnald Ramp over the holidays for a persistant cold.  He noticed a right thyroid mass and started a work up of the mass.  She has had no prior head or neck irradiation.  She has no family history of thyroid disease.  She's had no prior neck surgery.  [place] - 11/13/2013 - Thyromegaly with dominant 7.4 cm right mass. Findings meet consensus criteria for biopsy. TSH - 0.82 0 11/13/2013   Past Medical History  Diagnosis Date  . Arthritis     Past Surgical History  Procedure Laterality Date  . Tonsillectomy    . Dilation and curettage of uterus  1994, 2003   Current Outpatient Prescriptions  Medication Sig Dispense Refill  . chlorpheniramine-HYDROcodone (TUSSIONEX PENNKINETIC ER) 10-8 MG/5ML LQCR Take 5 mLs by mouth every 12 (twelve) hours as needed for cough.  115 mL  0  . mometasone (ASMANEX 60  METERED DOSES) 220 MCG/INH inhaler Inhale 2 puffs into the lungs daily.  1 Inhaler  0  . Naproxen-Esomeprazole (VIMOVO) 500-20 MG TBEC Take 1 tablet by mouth 2 (two) times daily.  180 tablet  1   No current facility-administered medications for this visit.     No Known Allergies  REVIEW OF SYSTEMS: Skin:  No history of rash.  No history of abnormal moles. Infection:  No history of hepatitis or HIV.  No history of MRSA. Neurologic:  No history of stroke.  No history of headaches. Cardiac:  No history of hypertension. No history of heart disease.   No history of seeing a cardiologist. Pulmonary:  Has 2-3 weeks cold/bronchitis.  Endocrine:  No diabetes. No thyroid disease. Gastrointestinal:  No history of stomach disease.  No history of liver disease.  No history of gall bladder disease.  No history of pancreas disease.  Colonoscopy by Dr. Collene Mares - 2010. Urologic:  No history of kidney stones.  No history of bladder infections. Musculoskeletal:  No history of joint or back disease. Hematologic:  No bleeding disorder.  No history of anemia.  Not anticoagulated. Psycho-social:  The patient is oriented.   The patient has no obvious psychologic or social impairment to understanding our conversation and plan.  SOCIAL and FAMILY HISTORY: Husband died of a MI about 3 years ago. She has 3 children:  2 boys -  25 and 21.  And daughter, Jarrett Soho, 38, a junior at [employer]. 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  She worked with Erling [employer] at one time. She was a [employer] at the same time I was.  Her maiden name was Sharlene Dory.  She was 1 year behind me in school and went to [employer].  She moved away her senior year.  She mentioned Derald Macleod and Cleophus Molt.  PHYSICAL EXAM: BP 132/86  Pulse 78  Resp 18  Ht 5' 5.75" (1.67 m)  Wt 195 lb (88.451 kg)  BMI 31.72 kg/m2  LMP 06/14/2010  General: WN WF who is alert and generally healthy appearing.  HEENT: Normal. Pupils equal. Neck:  Supple. No mass.  Visible fullness in right neck.  She has a soft, approx 6 cm mass, in the right thyroid.   Lymph Nodes:  No supraclavicular or cervical nodes. Lungs: Clear to auscultation and symmetric breath sounds. Heart:  RRR. No murmur or rub. Extremities:  Good strength and ROM  in upper and lower extremities. Neurologic:  Grossly intact to motor and sensory function. Psychiatric: Has normal mood and affect. Behavior is normal.   DATA REVIEWED: Notes in Epic and I gave her copies of her [place].  Though she had already seen it on My Chart.  Alphonsa Overall, MD,  [facility], [address]  [place], [place]    [place] Phone:  [phone] FAX:  [phone]

## 2013-12-20 NOTE — Transfer of Care (Signed)
Immediate Anesthesia Transfer of Care Note  Patient: Toni Ramirez  Procedure(s) Performed: Procedure(s): RIGHT THYROID LOBECTOMY (Right)  Patient Location: PACU  Anesthesia Type:General  Level of Consciousness: awake, sedated and patient cooperative  Airway & Oxygen Therapy: Patient Spontanous Breathing and Patient connected to face mask oxygen  Post-op Assessment: Report given to PACU RN and Post -op Vital signs reviewed and stable  Post vital signs: Reviewed and stable  Complications: No apparent anesthesia complications

## 2013-12-21 MED ORDER — HYDROCODONE-ACETAMINOPHEN 5-325 MG PO TABS: 1.0000 | ORAL_TABLET | Freq: Four times a day (QID) | ORAL | Status: DC | PRN

## 2013-12-21 NOTE — Op Note (Signed)
NAMELAURELYN, Toni Ramirez               ACCOUNT NO.:  [id number]  MEDICAL RECORD NO.:  [id number]  LOCATION:  5409                         FACILITY:  [facility]  PHYSICIAN:  Fenton Malling. Lucia Gaskins, M.D.  DATE OF BIRTH:  11-03-1956  DATE OF PROCEDURE:  12/20/2013                              OPERATIVE REPORT   PREOPERATIVE DIAGNOSIS:  Right thyroid mass, follicular lesion of undetermined significance.  POSTOPERATIVE DIAGNOSIS:  Right thyroid mass, follicular lesion of undetermined significance, final pathology pending.  PROCEDURE:  Right thyroid lobectomy.  SURGEON:  Fenton Malling. Lucia Gaskins, MD  FIRST ASSISTANT:  Isabel Caprice. Hassell Done, MD  ANESTHESIA:  General endotracheal.  ESTIMATED BLOOD LOSS:  Less than 50 mL.  DRAINS LEFT:  None.  INDICATIONS FOR INDICATION:  Toni Ramirez is a 58 year old white female who is a patient of Dr. Scarlette Calico who recently noticed an enlarging right thyroid mass.  An ultrasound revealed a dominant 7.4 cm right thyroid mass.  Fine needle aspiration biopsy that showed a follicular lesion of undetermined significance (Bethesda class 3).  I discussed with the patient about proceeding with right thyroid lobectomy.  The indications, potential complications were explained to the patient. Potential complications include, but are not limited to, bleeding, infection, injury to the recurrent laryngeal nerve, and injury to the parathyroid glands.  OPERATIVE NOTE:  The patient was taken to the operating room #1 at [facility], underwent a general endotracheal anesthesia.  She had a roll placed under her upper back to extend her neck.  Her neck was prepped with ChloraPrep and sterilely draped.  A time-out was held and the surgical checklist run.  I have made a transverse thyroid scar approximately 1.5 cm to 2 cm above her clavicle.  I cut down through the platysmas, elevated flaps up to the thyroid cartilage and down to the sternum.  I identified the strap muscles and went through the midline elevating  the strap muscles on the right side of the neck off the thyroid gland.  She had a very large dominant right thyroid lobe (about 8 to 9 cm in length) that was based more towards the upper pole of the right thyroid lobe. I was able to mobilize the superior pole clipping the superior pole vessels with medium clips and using the Harmonic Scalpel to divide the vessels.  I was able to roll the thyroid medially by releasing the middle thyroid vein, and took down the inferior thyroid vessels.  I thought I identified at least 1 parathyroid gland, and rolled the thyroid medially.  I thought I  identified the right recurrent laryngeal nerve where it dropped into the inferior cricopharyngeus muscle.  I left a small amount of thyroid tissue around this.  The patient had a very minimal isthmus only about a centimeter in size.  I divided the isthmus with Harmonic.  For the resected right thyroid lobe, I placed a long suture for the upper pole and a short suture for the isthmus.  I then irrigated the wound with saline.  There was no bleeding.  I placed Fibrillar into the dissected field, again examined the wound, there was no bleeding.  I then closed the midline strap muscles with interrupted 3-0 Vicryl  sutures.  I closed the platysma with interrupted 3-0 Vicryl sutures.  I closed the skin with a 5-0 Vicryl subcuticular suture.  I placed Steri-Strips on the wound with tincture of benzoin.  Sponge and needle count were correct at the end of the case.  The patient tolerated the procedure well, was transported to recovery room in good condition.  The plan is to keep her overnight.  Final pathology is pending at the time of this dictation.   Fenton Malling. Lucia Gaskins, M.D., FACS   DHN/MEDQ  D:  12/20/2013  T:  12/21/2013  Job:  [id number]  cc:   Scarlette Calico, MD [place] [address]

## 2013-12-21 NOTE — Discharge Instructions (Signed)
CENTRAL  SURGERY - DISCHARGE INSTRUCTIONS TO PATIENT  Return to work on:  1 week  Activity:  Driving - may drive in 2 or 3 days   Lifting - take it easy for 5 days, then no limit  Wound Care:   May shower starting tomorrow (remove dressing at that time).  Diet:  As tolerated.  Follow up appointment:  Call Dr. Pollie Friar office [facility]) at [phone] for an appointment in 2 to 3 weeks.  Medications and dosages:  Resume your home medications.  You have a prescription for:  Vicodin.  Call Dr. Lucia Gaskins or his office  [phone]) if you have:  Temperature greater than 100.4,  Persistent nausea and vomiting,  Severe uncontrolled pain,  Redness, tenderness, or signs of infection (pain, swelling, redness, odor or green/yellow discharge around the site),  Difficulty breathing, headache or visual disturbances,  Any other questions or concerns you may have after discharge.  In an emergency, call 911 or go to an Emergency Department at a nearby hospital.

## 2013-12-21 NOTE — Discharge Summary (Signed)
Physician Discharge Summary  Patient ID:  Toni Ramirez  MRN: [id number]  DOB/AGE: 59-16-1957 59 y.o.  Admit date: 12/20/2013 Discharge date: 12/21/2013  Discharge Diagnoses:  1. Thyroid mass, right lobe   7.4 cm on [place]  2. DJD of knee - doing fairly well.   Active Problems:   Thyroid mass, right lobe  Operation: Procedure(s):  RIGHT THYROID LOBECTOMY on 12/20/2013 - D. [facility]  Discharged Condition: good  Hospital Course: Toni Ramirez is an 58 y.o. female whose primary care physician is Scarlette Calico, MD and who was admitted 12/20/2013 with a chief complaint of right thyroid mass.   She was brought to the operating room on 12/20/2013 and underwent  RIGHT THYROID LOBECTOMY .  Voice seems good.  She is swallowing well.  She is ready for discharge. The discharge instructions were reviewed with the patient.  Consults: None  Significant Diagnostic Studies: Results for orders placed during the hospital encounter of 50/09/38  BASIC METABOLIC PANEL      Result Value Range   Sodium 140  137 - 147 mEq/L   Potassium 4.5  3.7 - 5.3 mEq/L   Chloride 104  96 - 112 mEq/L   CO2 25  19 - 32 mEq/L   Glucose, Bld 108 (*) 70 - 99 mg/dL   BUN 18  6 - 23 mg/dL   Creatinine, Ser 0.80  0.50 - 1.10 mg/dL   Calcium 9.6  8.4 - 10.5 mg/dL   GFR calc non Af Amer 80 (*) >90 mL/min   GFR calc Af Amer >90  >90 mL/min  CBC      Result Value Range   WBC 6.5  4.0 - 10.5 K/uL   RBC 4.79  3.87 - 5.11 MIL/uL   Hemoglobin 14.1  12.0 - 15.0 g/dL   HCT 41.4  36.0 - 46.0 %   MCV 86.4  78.0 - 100.0 fL   MCH 29.4  26.0 - 34.0 pg   MCHC 34.1  30.0 - 36.0 g/dL   RDW 13.1  11.5 - 15.5 %   Platelets 302  150 - 400 K/uL    Discharge Exam:  Filed Vitals:   12/21/13 0600  BP: 122/79  Pulse: 81  Temp: 97.9 F (36.6 C)  Resp: 16    General: WN WF who is alert and generally healthy appearing.  Neck:  Dressing dry without swelling.  Discharge Medications:     Medication List         acetaminophen 325 MG  tablet  Commonly known as:  TYLENOL  Take 650 mg by mouth every 6 (six) hours as needed for moderate pain.     HYDROcodone-acetaminophen 5-325 MG per tablet  Commonly known as:  NORCO/VICODIN  Take 1-2 tablets by mouth every 6 (six) hours as needed.     ibuprofen 200 MG tablet  Commonly known as:  ADVIL,MOTRIN  Take 800 mg by mouth every 12 (twelve) hours as needed for moderate pain.     mometasone 220 MCG/INH inhaler  Commonly known as:  ASMANEX  Inhale 2 puffs into the lungs daily as needed (wheezing).        Disposition: Final discharge disposition not confirmed      Discharge Orders   Future Appointments Provider Department Dept Phone   01/08/2014 10:00 AM Shann Medal, MD [facility] Surgery, [place] [phone]   Future Orders Complete By Expires   Diet - low sodium heart healthy  As directed    Increase activity slowly  As directed       Return to work on:  1 week  Activity:  Driving - may drive in 2 or 3 days   Lifting - take it easy for 5 days, then no limit  Wound Care:   May shower starting tomorrow (remove dressing at that time).  Diet:  As tolerated.  Follow up appointment:  Call Dr. Pollie Friar office [facility]) at [phone] for an appointment in 2 to 3 weeks.  Medications and dosages:  Resume your home medications.  You have a prescription for:  Vicodin.   Signed: Alphonsa Overall, M.D., [facility] Surgery Office:  [phone]  12/21/2013, 7:51 AM

## 2013-12-23 ENCOUNTER — Encounter (HOSPITAL_COMMUNITY): Payer: Self-pay | Admitting: Surgery

## 2013-12-24 ENCOUNTER — Encounter (HOSPITAL_COMMUNITY)
Admission: RE | Admit: 2013-12-24 | Discharge: 2013-12-24 | Disposition: A | Discharge status: Home / Self Care | Payer: BC Managed Care – PPO | Source: Ambulatory Visit | Attending: Surgery | Admitting: Surgery

## 2013-12-24 ENCOUNTER — Other Ambulatory Visit (INDEPENDENT_AMBULATORY_CARE_PROVIDER_SITE_OTHER): Payer: Self-pay | Admitting: Surgery

## 2013-12-24 ENCOUNTER — Encounter (HOSPITAL_COMMUNITY): Payer: Self-pay

## 2013-12-24 HISTORY — DX: Malignant (primary) neoplasm, unspecified: C80.1

## 2013-12-24 LAB — COMPREHENSIVE METABOLIC PANEL
ALT: 22 U/L (ref 0–35)
AST: 18 U/L (ref 0–37)
Albumin: 4.1 g/dL (ref 3.5–5.2)
Alkaline Phosphatase: 83 U/L (ref 39–117)
BUN: 14 mg/dL (ref 6–23)
CO2: 28 mEq/L (ref 19–32)
Calcium: 9.7 mg/dL (ref 8.4–10.5)
Chloride: 101 mEq/L (ref 96–112)
Creatinine, Ser: 0.86 mg/dL (ref 0.50–1.10)
GFR calc non Af Amer: 73 mL/min — ABNORMAL LOW (ref >90)
GFR, EST AFRICAN AMERICAN: 85 mL/min — AB (ref >90)
Glucose, Bld: 122 mg/dL — ABNORMAL HIGH (ref 70–99)
Potassium: 4.7 mEq/L (ref 3.7–5.3)
SODIUM: 140 meq/L (ref 137–147)
TOTAL PROTEIN: 8 g/dL (ref 6.0–8.3)
Total Bilirubin: 0.5 mg/dL (ref 0.3–1.2)

## 2013-12-24 NOTE — Patient Instructions (Addendum)
[place]  12/24/2013   Your procedure is scheduled on: 2-11  -2015  Report to [facility] at     1100   AM.  Call this number if you have problems the morning of surgery: [phone]  Or Presurgical Testing [phone](Nickole Adamek) For Living Will and/or Health Care Power Attorney Forms: please provide copy for your medical record,may bring AM of surgery(Forms should be already notarized -we do not provide this service).  Remember: Follow any bowel prep instructions per MD office. For Cpap use: Bring mask and tubing only.   Do not eat food:After Midnight.  May have clear liquids:up to 6 Hours before arrival. Nothing after : 0700 AM  Clear liquids include soda, tea, black coffee, apple or grape juice, broth.  Take these medicines the morning of surgery with A SIP OF WATER: Tylenol. Hydrocodone. Bring/use Asmanex inhaler.   Do not wear jewelry, make-up or nail polish.  Do not wear lotions, powders, or perfumes. You may wear deodorant.  Do not shave 48 hours(2 days) prior to first CHG shower(legs and under arms).(Shaving face and neck okay.)  Do not bring valuables to the hospital.(Hospital is not responsible for lost valuables).  Contacts, dentures or removable bridgework, body piercing, hair pins may not be worn into surgery.  Leave suitcase in the car. After surgery it may be brought to your room.  For patients admitted to the hospital, checkout time is 11:00 AM the day of discharge.(Restricted visitors-Any Persons displaying flu-like symptoms or illness).    Patients discharged the day of surgery will not be allowed to drive home. Must have responsible person with you x 24 hours once discharged.  Name and phone number of your driver: Harriette Ohara [phone]/ [phone]  Special Instructions: CHG(Chlorhedine 4%-"Hibiclens","Betasept","Aplicare") Shower Use Special Wash: see special instructions.(avoid face and genitals)    Failure to follow these  instructions may result in Cancellation of your surgery.   Patient signature_______________________________________________________

## 2013-12-24 NOTE — Pre-Procedure Instructions (Signed)
12-24-13 CBC,BMP-12-16-13 -Epic, CMP done today. CXR 11-20-13, EKG 12-16-13 Epic.

## 2013-12-25 ENCOUNTER — Observation Stay (HOSPITAL_COMMUNITY)
Admission: RE | Admit: 2013-12-25 | Discharge: 2013-12-26 | Disposition: A | Discharge status: Home / Self Care | Payer: BC Managed Care – PPO | Source: Ambulatory Visit | Attending: Surgery | Admitting: Surgery

## 2013-12-25 ENCOUNTER — Encounter (HOSPITAL_COMMUNITY)
Admission: RE | Disposition: A | Discharge status: Home / Self Care | Payer: Self-pay | Source: Ambulatory Visit | Attending: Surgery

## 2013-12-25 ENCOUNTER — Encounter (HOSPITAL_COMMUNITY): Payer: Self-pay | Admitting: *Deleted

## 2013-12-25 ENCOUNTER — Ambulatory Visit (HOSPITAL_COMMUNITY): Payer: BC Managed Care – PPO | Admitting: Anesthesiology

## 2013-12-25 ENCOUNTER — Encounter (HOSPITAL_COMMUNITY): Payer: BC Managed Care – PPO | Admitting: Anesthesiology

## 2013-12-25 DIAGNOSIS — M1710 Unilateral primary osteoarthritis, unspecified knee: Secondary | ICD-10-CM | POA: Insufficient documentation

## 2013-12-25 DIAGNOSIS — C73 Malignant neoplasm of thyroid gland: Secondary | ICD-10-CM | POA: Diagnosis present

## 2013-12-25 DIAGNOSIS — Z79899 Other long term (current) drug therapy: Secondary | ICD-10-CM | POA: Insufficient documentation

## 2013-12-25 DIAGNOSIS — D34 Benign neoplasm of thyroid gland: Principal | ICD-10-CM | POA: Insufficient documentation

## 2013-12-25 HISTORY — PX: THYROIDECTOMY: SHX17

## 2013-12-25 HISTORY — DX: Malignant neoplasm of thyroid gland: C73

## 2013-12-25 SURGERY — THYROIDECTOMY
Anesthesia: General | Site: Neck

## 2013-12-25 MED ORDER — DEXAMETHASONE SODIUM PHOSPHATE 10 MG/ML IJ SOLN
INTRAMUSCULAR | Status: DC | PRN
  Administered 2013-12-25: 10 mg via INTRAVENOUS

## 2013-12-25 MED ORDER — FENTANYL CITRATE 0.05 MG/ML IJ SOLN
INTRAMUSCULAR | Status: AC
Start: 2013-12-25 — End: 2013-12-25
  Filled 2013-12-25: qty 5

## 2013-12-25 MED ORDER — CYCLOSPORINE 0.05 % OP EMUL: 1.0000 [drp] | Freq: Two times a day (BID) | OPHTHALMIC | Status: DC

## 2013-12-25 MED ORDER — ONDANSETRON HCL 4 MG/2ML IJ SOLN
INTRAMUSCULAR | Status: AC
  Filled 2013-12-25: qty 2

## 2013-12-25 MED ORDER — IBUPROFEN 600 MG PO TABS
600.0000 mg | ORAL_TABLET | Freq: Four times a day (QID) | ORAL | Status: DC | PRN
  Administered 2013-12-26: 600 mg via ORAL
  Filled 2013-12-25: qty 1

## 2013-12-25 MED ORDER — HYDROMORPHONE HCL PF 1 MG/ML IJ SOLN
INTRAMUSCULAR | Status: AC
  Filled 2013-12-25: qty 1

## 2013-12-25 MED ORDER — MORPHINE SULFATE 2 MG/ML IJ SOLN: 1.0000 mg | INTRAMUSCULAR | Status: DC | PRN

## 2013-12-25 MED ORDER — BUPIVACAINE HCL (PF) 0.5 % IJ SOLN
INTRAMUSCULAR | Status: AC
  Filled 2013-12-25: qty 30

## 2013-12-25 MED ORDER — PROPOFOL 10 MG/ML IV BOLUS
INTRAVENOUS | Status: DC | PRN
  Administered 2013-12-25: 200 mg via INTRAVENOUS

## 2013-12-25 MED ORDER — ROCURONIUM BROMIDE 100 MG/10ML IV SOLN
INTRAVENOUS | Status: DC | PRN
  Administered 2013-12-25: 10 mg via INTRAVENOUS
  Administered 2013-12-25: 50 mg via INTRAVENOUS
  Administered 2013-12-25 (×2): 10 mg via INTRAVENOUS

## 2013-12-25 MED ORDER — MIDAZOLAM HCL 2 MG/2ML IJ SOLN
INTRAMUSCULAR | Status: AC
  Filled 2013-12-25: qty 2

## 2013-12-25 MED ORDER — GLYCOPYRROLATE 0.2 MG/ML IJ SOLN
INTRAMUSCULAR | Status: DC | PRN
  Administered 2013-12-25: 0.6 mg via INTRAVENOUS

## 2013-12-25 MED ORDER — ONDANSETRON HCL 4 MG/2ML IJ SOLN
INTRAMUSCULAR | Status: DC | PRN
  Administered 2013-12-25: 4 mg via INTRAVENOUS

## 2013-12-25 MED ORDER — CALCIUM CITRATE-VITAMIN D 500-400 MG-UNIT PO CHEW
2.0000 | CHEWABLE_TABLET | Freq: Two times a day (BID) | ORAL | Status: DC
  Administered 2013-12-25 – 2013-12-26 (×2): 2 via ORAL
  Filled 2013-12-25 (×3): qty 2

## 2013-12-25 MED ORDER — HYDROMORPHONE HCL PF 2 MG/ML IJ SOLN
INTRAMUSCULAR | Status: AC
  Filled 2013-12-25: qty 1

## 2013-12-25 MED ORDER — NEOSTIGMINE METHYLSULFATE 1 MG/ML IJ SOLN
INTRAMUSCULAR | Status: AC
  Filled 2013-12-25: qty 10

## 2013-12-25 MED ORDER — PROPOFOL 10 MG/ML IV BOLUS
INTRAVENOUS | Status: AC
  Filled 2013-12-25: qty 20

## 2013-12-25 MED ORDER — DOCUSATE SODIUM 100 MG PO CAPS
100.0000 mg | ORAL_CAPSULE | Freq: Two times a day (BID) | ORAL | Status: DC
  Administered 2013-12-25 – 2013-12-26 (×2): 100 mg via ORAL
  Filled 2013-12-25 (×3): qty 1

## 2013-12-25 MED ORDER — POLYVINYL ALCOHOL 1.4 % OP SOLN
1.0000 [drp] | OPHTHALMIC | Status: DC | PRN
  Administered 2013-12-25: 1 [drp] via OPHTHALMIC
  Filled 2013-12-25: qty 15

## 2013-12-25 MED ORDER — DEXAMETHASONE SODIUM PHOSPHATE 10 MG/ML IJ SOLN
INTRAMUSCULAR | Status: AC
  Filled 2013-12-25: qty 1

## 2013-12-25 MED ORDER — MIDAZOLAM HCL 5 MG/5ML IJ SOLN
INTRAMUSCULAR | Status: DC | PRN
  Administered 2013-12-25: 2 mg via INTRAVENOUS

## 2013-12-25 MED ORDER — HYDROCODONE-ACETAMINOPHEN 5-325 MG PO TABS
1.0000 | ORAL_TABLET | ORAL | Status: DC | PRN
  Administered 2013-12-25: 2 via ORAL
  Filled 2013-12-25: qty 2

## 2013-12-25 MED ORDER — KCL IN DEXTROSE-NACL 20-5-0.45 MEQ/L-%-% IV SOLN
INTRAVENOUS | Status: DC
  Administered 2013-12-25: 75 mL/h via INTRAVENOUS
  Administered 2013-12-26: 07:00:00 via INTRAVENOUS
  Filled 2013-12-25 (×2): qty 1000

## 2013-12-25 MED ORDER — FENTANYL CITRATE 0.05 MG/ML IJ SOLN
INTRAMUSCULAR | Status: DC | PRN
  Administered 2013-12-25 (×3): 50 ug via INTRAVENOUS
  Administered 2013-12-25: 100 ug via INTRAVENOUS

## 2013-12-25 MED ORDER — 0.9 % SODIUM CHLORIDE (POUR BTL) OPTIME
TOPICAL | Status: DC | PRN
  Administered 2013-12-25: 1000 mL

## 2013-12-25 MED ORDER — HYDROMORPHONE HCL PF 1 MG/ML IJ SOLN
INTRAMUSCULAR | Status: DC | PRN
  Administered 2013-12-25 (×2): 1 mg via INTRAVENOUS

## 2013-12-25 MED ORDER — ONDANSETRON HCL 4 MG/2ML IJ SOLN: 4.0000 mg | Freq: Four times a day (QID) | INTRAMUSCULAR | Status: DC | PRN

## 2013-12-25 MED ORDER — CHLORHEXIDINE GLUCONATE 4 % EX LIQD: 1.0000 "application " | Freq: Once | CUTANEOUS | Status: DC

## 2013-12-25 MED ORDER — HYDROMORPHONE HCL PF 1 MG/ML IJ SOLN
0.2500 mg | INTRAMUSCULAR | Status: DC | PRN
  Administered 2013-12-25: 0.25 mg via INTRAVENOUS

## 2013-12-25 MED ORDER — CEFAZOLIN SODIUM-DEXTROSE 2-3 GM-% IV SOLR
2.0000 g | INTRAVENOUS | Status: AC
Start: 2013-12-25 — End: 2013-12-25
  Administered 2013-12-25: 2 g via INTRAVENOUS

## 2013-12-25 MED ORDER — PROMETHAZINE HCL 25 MG/ML IJ SOLN: 6.2500 mg | INTRAMUSCULAR | Status: DC | PRN

## 2013-12-25 MED ORDER — LIDOCAINE HCL (CARDIAC) 20 MG/ML IV SOLN
INTRAVENOUS | Status: AC
  Filled 2013-12-25: qty 5

## 2013-12-25 MED ORDER — LIDOCAINE HCL (CARDIAC) 20 MG/ML IV SOLN
INTRAVENOUS | Status: DC | PRN
  Administered 2013-12-25: 80 mg via INTRAVENOUS

## 2013-12-25 MED ORDER — ROCURONIUM BROMIDE 100 MG/10ML IV SOLN
INTRAVENOUS | Status: AC
  Filled 2013-12-25: qty 1

## 2013-12-25 MED ORDER — LEVOTHYROXINE SODIUM 100 MCG PO TABS
100.0000 ug | ORAL_TABLET | Freq: Every day | ORAL | Status: DC
  Administered 2013-12-26: 100 ug via ORAL
  Filled 2013-12-25 (×2): qty 1

## 2013-12-25 MED ORDER — ONDANSETRON HCL 4 MG PO TABS: 4.0000 mg | ORAL_TABLET | Freq: Four times a day (QID) | ORAL | Status: DC | PRN

## 2013-12-25 MED ORDER — GLYCOPYRROLATE 0.2 MG/ML IJ SOLN
INTRAMUSCULAR | Status: AC
  Filled 2013-12-25: qty 3

## 2013-12-25 MED ORDER — NEOSTIGMINE METHYLSULFATE 1 MG/ML IJ SOLN
INTRAMUSCULAR | Status: DC | PRN
  Administered 2013-12-25: 4 mg via INTRAVENOUS

## 2013-12-25 MED ORDER — LACTATED RINGERS IV SOLN
INTRAVENOUS | Status: DC
  Administered 2013-12-25: 1000 mL via INTRAVENOUS

## 2013-12-25 MED ORDER — CEFAZOLIN SODIUM-DEXTROSE 2-3 GM-% IV SOLR
INTRAVENOUS | Status: AC
  Filled 2013-12-25: qty 50

## 2013-12-25 MED ORDER — LACTATED RINGERS IV SOLN: INTRAVENOUS | Status: DC | PRN

## 2013-12-25 MED ORDER — BUPIVACAINE HCL (PF) 0.25 % IJ SOLN
INTRAMUSCULAR | Status: DC | PRN
  Administered 2013-12-25: 10 mL

## 2013-12-25 SURGICAL SUPPLY — 44 items
ATTRACTOMAT 16X20 MAGNETIC DRP (DRAPES) ×3 IMPLANT
BENZOIN TINCTURE PRP APPL 2/3 (GAUZE/BANDAGES/DRESSINGS) ×3 IMPLANT
BLADE HEX COATED 2.75 (ELECTRODE) ×3 IMPLANT
BLADE SURG 15 STRL LF DISP TIS (BLADE) ×2 IMPLANT
BLADE SURG 15 STRL SS (BLADE) ×4
CANISTER SUCTION 2500CC (MISCELLANEOUS) IMPLANT
CLIP TI WIDE RED SMALL 6 (CLIP) ×6 IMPLANT
CLOSURE WOUND 1/2 X4 (GAUZE/BANDAGES/DRESSINGS) ×1
CLOSURE WOUND 1/4 X3 (GAUZE/BANDAGES/DRESSINGS) ×2
DISSECTOR ROUND CHERRY 3/8 STR (MISCELLANEOUS) ×6 IMPLANT
DRAIN PENROSE 18X1/4 LTX STRL (WOUND CARE) IMPLANT
DRAPE PED LAPAROTOMY (DRAPES) ×3 IMPLANT
DRESSING SURGICEL FIBRLLR 1X2 (HEMOSTASIS) ×1 IMPLANT
DRSG SURGICEL FIBRILLAR 1X2 (HEMOSTASIS) ×3
ELECT COATED BLADE 2.86 ST (ELECTRODE) ×3 IMPLANT
ELECT REM PT RETURN 9FT ADLT (ELECTROSURGICAL) ×3
ELECTRODE REM PT RTRN 9FT ADLT (ELECTROSURGICAL) ×1 IMPLANT
GAUZE SPONGE 4X4 16PLY XRAY LF (GAUZE/BANDAGES/DRESSINGS) ×6 IMPLANT
GLOVE BIOGEL PI IND STRL 7.0 (GLOVE) ×1 IMPLANT
GLOVE BIOGEL PI INDICATOR 7.0 (GLOVE) ×2
GLOVE SURG SIGNA 7.5 PF LTX (GLOVE) ×3 IMPLANT
GOWN STRL REUS W/TWL LRG LVL3 (GOWN DISPOSABLE) ×3 IMPLANT
GOWN STRL REUS W/TWL XL LVL3 (GOWN DISPOSABLE) ×6 IMPLANT
HEMOSTAT SURGICEL 2X14 (HEMOSTASIS) IMPLANT
KIT BASIN OR (CUSTOM PROCEDURE TRAY) ×3 IMPLANT
NS IRRIG 1000ML POUR BTL (IV SOLUTION) ×3 IMPLANT
PACK BASIC VI WITH GOWN DISP (CUSTOM PROCEDURE TRAY) ×3 IMPLANT
PENCIL BUTTON HOLSTER BLD 10FT (ELECTRODE) ×3 IMPLANT
SPONGE GAUZE 4X4 12PLY (GAUZE/BANDAGES/DRESSINGS) ×3 IMPLANT
STAPLER VISISTAT 35W (STAPLE) ×3 IMPLANT
STRIP CLOSURE SKIN 1/2X4 (GAUZE/BANDAGES/DRESSINGS) ×2 IMPLANT
STRIP CLOSURE SKIN 1/4X3 (GAUZE/BANDAGES/DRESSINGS) ×4 IMPLANT
SUT MON AB 5-0 PS2 18 (SUTURE) ×3 IMPLANT
SUT SILK 2 0 (SUTURE)
SUT SILK 2-0 18XBRD TIE 12 (SUTURE) IMPLANT
SUT SILK 3 0 (SUTURE) ×2
SUT SILK 3 0 SH CR/8 (SUTURE) IMPLANT
SUT SILK 3-0 18XBRD TIE 12 (SUTURE) ×1 IMPLANT
SUT VIC AB 0 CT1 36 (SUTURE) IMPLANT
SUT VIC AB 3-0 SH 18 (SUTURE) ×6 IMPLANT
SYR BULB IRRIGATION 50ML (SYRINGE) ×3 IMPLANT
TOWEL OR 17X26 10 PK STRL BLUE (TOWEL DISPOSABLE) ×3 IMPLANT
WATER STERILE IRR 1500ML POUR (IV SOLUTION) IMPLANT
YANKAUER SUCT BULB TIP 10FT TU (MISCELLANEOUS) ×3 IMPLANT

## 2013-12-25 NOTE — Op Note (Signed)
12/25/2013  3:29 PM  PATIENT:  Toni Ramirez, 58 y.o., female, MRN: [id number]  PREOP DIAGNOSIS:  Right lobe thyroid cancer  POSTOP DIAGNOSIS:   Right lobe thyroid cancer  PROCEDURE:   Procedure(s):  Left thyroidectomy and resection of the remainder of the thyroid isthmus  (COMPLETION THYROIDECTOMY)  SURGEON:   Alphonsa Overall, M.D.  Terrence DupontHassell Done, M.D.  ANESTHESIA:   general  Anesthesiologist: Myrtie Soman, MD CRNA: Ofilia Neas, CRNA; Peggy Williford; Lollie Sails, CRNA  General  EBL:  minimal  ml  BLOOD ADMINISTERED: none  DRAINS: none   LOCAL MEDICATIONS USED:   10 cc 1/4 marcaine  SPECIMEN:   Left thyroid lobe and isthmus (long suture superior pole and short suture isthmus0  COUNTS CORRECT:  YES  INDICATIONS FOR PROCEDURE:  Toni Ramirez is a 58 y.o. (DOB: 11-20-1955) white  female whose primary care physician is Toni Calico, MD and comes for completion thyroidectomy.  I did a right thyroid lobectomy on her on Friday, 12/20/2013, and the final report revealed a 6.0 cm follicular variant of the papillary thyroid cancer.   The indications and risks of the surgery were explained to the patient.  The risks include, but are not limited to, infection, bleeding, and nerve injury.  Note dictated to:     The patient was taken to the operating room #1 at [facility], underwent a general endotracheal anesthesia. She had a roll placed under her upper back to extend her neck. Her neck was prepped with ChloraPrep and sterilely draped. She was given 2 gm of Ancef because this was a second operation in one week.  A time-out was held and the surgical checklist run.    I opened the transverse thyroid incision that I made at the last operation.  I cut down through the platysmas, elevated flaps up to the thyroid cartilage and down to the sternum. I identified the strap muscles and went through the midline elevating the strap muscles on the right side of the neck off the thyroid gland.  This  involved mainly taking down the prior operation and exploration.    I cleaned up the right side, but did not do much to this side.  I elevated the strap muscles off the left thyroid lobe.  It was fairly stuck, I expect because of her recent surgery.  I was able to mobilize the superior pole clipping the superior pole vessels with medium clips and using the Harmonic Scalpel to divide the vessels. I was able to roll the thyroid medially by releasing the middle thyroid vein, and took down the inferior thyroid vessels.    I thought I identified at least 1 parathyroid gland, and rolled the thyroid medially. I thought I identified the left recurrent laryngeal nerve where it dropped into the inferior cricopharyngeus muscle. I left a small amount of thyroid tissue around this area.  I resected the remainder of the thyroid isthmus. For the resected left thyroid lobe, I placed a long suture for the upper pole and a short suture for the isthmus.  There was not much tissue in her central compartment.  There were no obvious enlarged lymph nodes centrally or in the left or right neck.  So no further tissue was dissected.   I then irrigated the wound with saline. There was no bleeding. I placed Fibrillar into the dissected field, again examined the wound, there was no bleeding. I then closed the midline strap muscles with interrupted 3-0 Vicryl sutures. I  closed the platysma with interrupted 3-0 Vicryl sutures. I closed the skin with a 5-0 Vicryl subcuticular suture. I placed Steri-Strips on the wound with tincture of benzoin. Sponge and needle count were correct at the end of the case.    The patient tolerated the procedure well, was transported to recovery room in good condition. The plan is to keep her overnight. Final pathology is pending at the time of this dictation.    Fenton Malling. Lucia Gaskins, M.D., FACS

## 2013-12-25 NOTE — Transfer of Care (Signed)
Immediate Anesthesia Transfer of Care Note  Patient: Toni Ramirez  Procedure(s) Performed: Procedure(s): COMPLETION THYROIDECTOMY (N/A)  Patient Location: PACU  Anesthesia Type:General  Level of Consciousness: awake, alert  and oriented  Airway & Oxygen Therapy: Patient Spontanous Breathing and Patient connected to face mask oxygen  Post-op Assessment: Report given to PACU RN, Post -op Vital signs reviewed and stable and Patient moving all extremities X 4  Post vital signs: Reviewed and stable  Complications: No apparent anesthesia complications

## 2013-12-25 NOTE — H&P (Addendum)
Re: Toni Ramirez  DOB: [birth year]  MRN: [id number]   ASSESSMENT AND PLAN:  1. Thyroid mass, right lobe   6.0 follicular variant of papillary thyroid cancer  Plan completion thyroidectomy today.  Risks include, but are not limited to bleeding, infection, nerve injury (particularly the recurrent laryngeal nerve), and parathyroid injury.  Will see endocrinology post op for thyroid hormone management/further treatment. Friend with her today - Toni Ramirez.  2. DJD of knee - doing fairly well.   Chief Complaint   Patient presents with   .  New Evaluation     thy mass   REFERRING PHYSICIAN: Scarlette Calico, MD   HISTORY OF PRESENT ILLNESS:  Toni Ramirez is a 58 y.o. (DOB: 07/30/1956) white female whose primary care physician is Scarlette Calico, MD and comes to me today for right thyroid mass.  I did a right thyroid lobectomy on 12/20/2013.  The final path showed 6.0 follicular variant of papillary thyroid cancer. She comes back today for completion thyroidectomy.  History of thyroid mass: She did not notice anything, but went to Dr. Ronnald Ramp over the holidays for a persistant cold. He noticed a right thyroid mass and started a work up of the mass. She has had no prior head or neck irradiation. She has no family history of thyroid disease. She's had no prior neck surgery.  [place] - 11/13/2013 - Thyromegaly with dominant 7.4 cm right mass. Findings meet consensus criteria for biopsy.  TSH - 0.82 0 11/13/2013   Past Medical History   Diagnosis  Date   .  Arthritis     Past Surgical History   Procedure  Laterality  Date   .  Tonsillectomy     .  Dilation and curettage of uterus   1994, 2003    Current Outpatient Prescriptions   Medication  Sig  Dispense  Refill   .  chlorpheniramine-HYDROcodone (TUSSIONEX PENNKINETIC ER) 10-8 MG/5ML LQCR  Take 5 mLs by mouth every 12 (twelve) hours as needed for cough.  115 mL  0   .  mometasone (ASMANEX 60 METERED DOSES) 220 MCG/INH inhaler  Inhale 2 puffs  into the lungs daily.  1 Inhaler  0   .  Naproxen-Esomeprazole (VIMOVO) 500-20 MG TBEC  Take 1 tablet by mouth 2 (two) times daily.  180 tablet  1    No current facility-administered medications for this visit.   No Known Allergies   REVIEW OF SYSTEMS:  Skin: No history of rash. No history of abnormal moles.  Infection: No history of hepatitis or HIV. No history of MRSA.  Neurologic: No history of stroke. No history of headaches.  Cardiac: No history of hypertension. No history of heart disease. No history of seeing a cardiologist.  Pulmonary: Has 2-3 weeks cold/bronchitis.  Endocrine: No diabetes. No thyroid disease.  Gastrointestinal: No history of stomach disease. No history of liver disease. No history of gall bladder disease. No history of pancreas disease. Colonoscopy by Dr. Collene Mares - 2010.  Urologic: No history of kidney stones. No history of bladder infections.  Musculoskeletal: No history of joint or back disease.  Hematologic: No bleeding disorder. No history of anemia. Not anticoagulated.  Psycho-social: The patient is oriented. The patient has no obvious psychologic or social impairment to understanding our conversation and plan.   SOCIAL and FAMILY HISTORY:  Husband died of a MI about 3 years ago.  She has 3 children: 2 boys - 25 and 82. And daughter, Jarrett Soho, 26, a junior at  [employer].  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 She worked with [employer] at one time.  She was a [employer] at the same time I was. Her maiden name was Sharlene Dory. She was 1 year behind me in school and went to [employer]. She moved away her senior year. She mentioned Derald Macleod and Cleophus Molt.   PHYSICAL EXAM:  BP 129/68  Pulse 95  Temp(Src) 98.4 F (36.9 C) (Oral)  Resp 18  SpO2 97%  LMP 06/14/2010  Wt 195 lb (88.451 kg)  BMI 31.72 kg/m2  LMP 06/14/2010   General: WN WF who is alert and generally healthy appearing.  HEENT: Normal. Pupils equal.  Neck: Incision looks  okay. Lymph Nodes: No supraclavicular or cervical nodes.  Lungs: Clear to auscultation and symmetric breath sounds.  Heart: RRR. No murmur or rub.  Extremities: Good strength and ROM in upper and lower extremities.  Neurologic: Grossly intact to motor and sensory function.  Psychiatric: Has normal mood and affect. Behavior is normal.   DATA REVIEWED:  Notes in Epic.   Alphonsa Overall, MD, [facility], [address] [place], [place]  Phone: [phone] FAX: [phone]

## 2013-12-25 NOTE — Preoperative (Signed)
Beta Blockers   Reason not to administer Beta Blockers:Not Applicable 

## 2013-12-25 NOTE — Anesthesia Preprocedure Evaluation (Signed)
Anesthesia Evaluation  Patient identified by MRN, date of birth, ID band Patient awake    Reviewed: Allergy & Precautions, H&P , NPO status , Patient's Chart, lab work & pertinent test results  Airway Mallampati: II  TM Distance: >3 FB Neck ROM: Full    Dental no notable dental hx.    Pulmonary neg pulmonary ROS,  breath sounds clear to auscultation  Pulmonary exam normal       Cardiovascular negative cardio ROS  Rhythm:Regular Rate:Normal     Neuro/Psych negative neurological ROS  negative psych ROS   GI/Hepatic negative GI ROS, Neg liver ROS,   Endo/Other  negative endocrine ROS  Renal/GU negative Renal ROS  negative genitourinary   Musculoskeletal negative musculoskeletal ROS (+)   Abdominal   Peds negative pediatric ROS (+)  Hematology negative hematology ROS (+)   Anesthesia Other Findings   Reproductive/Obstetrics negative OB ROS                            Anesthesia Physical Anesthesia Plan  ASA: II  Anesthesia Plan: General   Post-op Pain Management:    Induction: Intravenous  Airway Management Planned: Oral ETT  Additional Equipment:   Intra-op Plan:   Post-operative Plan: Extubation in OR  Informed Consent: I have reviewed the patients History and Physical, chart, labs and discussed the procedure including the risks, benefits and alternatives for the proposed anesthesia with the patient or authorized representative who has indicated his/her understanding and acceptance.   Dental advisory given  Plan Discussed with: CRNA and Surgeon  Anesthesia Plan Comments:         Anesthesia Quick Evaluation  

## 2013-12-26 ENCOUNTER — Other Ambulatory Visit (INDEPENDENT_AMBULATORY_CARE_PROVIDER_SITE_OTHER): Payer: Self-pay

## 2013-12-26 ENCOUNTER — Telehealth (INDEPENDENT_AMBULATORY_CARE_PROVIDER_SITE_OTHER): Payer: Self-pay | Admitting: General Surgery

## 2013-12-26 ENCOUNTER — Encounter (HOSPITAL_COMMUNITY): Payer: Self-pay | Admitting: Surgery

## 2013-12-26 DIAGNOSIS — E215 Disorder of parathyroid gland, unspecified: Secondary | ICD-10-CM

## 2013-12-26 LAB — CALCIUM: CALCIUM: 9.3 mg/dL (ref 8.4–10.5)

## 2013-12-26 MED ORDER — CA CARB-FA-D-B6-B12-BORON-MG 1342-1 MG PO WAFR: 1.0000 | WAFER | Freq: Two times a day (BID) | ORAL | Status: DC

## 2013-12-26 MED ORDER — LEVOTHYROXINE SODIUM 100 MCG PO TABS: 100.0000 ug | ORAL_TABLET | Freq: Every day | ORAL | Status: DC

## 2013-12-26 NOTE — Discharge Summary (Deleted)
Gave patient discharge instructions. Patient understood well.

## 2013-12-26 NOTE — Discharge Summary (Signed)
  Physician Discharge Summary  Patient ID:  Toni Ramirez  MRN: [id number]  DOB/AGE: [birth year] 58 y.o.  Admit date: 12/25/2013 Discharge date: 12/26/2013  Discharge Diagnoses:  1.  Thyroid cancer of right lobe of thyroid - follicular variant of a papillary thyroid cancer measuring 6.0 cm 2.  DJD of knee   Active Problems:   Thyroid cancer  Operation: Procedure(s): Left lobe thyroidectomy, (COMPLETION THYROIDECTOMY) on 12/25/2013 - D. [facility]  Discharged Condition: good  Hospital Course: [name] is an 58 y.o. female whose primary care physician is Scarlette Calico, MD and who was admitted 12/25/2013 with a chief complaint of right thyroid cancer.   She was brought to the operating room on 12/25/2013 and underwent  Left lobe thyroidectomy, COMPLETION THYROIDECTOMY .  Her serum Ca++ is 9.3.  The discharge instructions were reviewed with the patient.  Consults: None  Significant Diagnostic Studies: Results for orders placed during the hospital encounter of 12/25/13  CALCIUM      Result Value Ref Range   Calcium 9.3  8.4 - 10.5 mg/dL   Discharge Exam:  Filed Vitals:   12/26/13 0605  BP: 118/79  Pulse: 89  Temp: 97.7 F (36.5 C)  Resp: 18   General: WN WF who is alert and generally healthy appearing.  Voice good. Neck: Dressing dry.  Wound looks good.  Discharge Medications:     Medication List    ASK your doctor about these medications       HYDROcodone-acetaminophen 5-325 MG per tablet  Commonly known as:  NORCO/VICODIN  Take 1-2 tablets by mouth every 6 (six) hours as needed.        Disposition: 01-Home or Self Care       Future Appointments Provider Department Dept Phone   01/08/2014 10:00 AM Shann Medal, MD [facility] Surgery, [place] [phone]     Activity:  Driving - May drive in 2 or 3 days   Lifting - Take it easy for a few days, then no limit  Wound Care:   Leave bandage until tomorrow, then may remove and shower.  Leave steristips  on.  Diet:  As tolerated.  Follow up appointment:  Call Dr. Pollie Friar office [facility] Surgery) at [phone] for an appointment in 2 to 3 weeks.           We will make an appointment for you with an endocrinologist.  The patient will call us with the name of whom she wants to see.  Some of her friends have seen an endocrinologist, but she does have the name.  Medications and dosages:  Resume your home medications.  You have a prescription for:  Vicodin and Calcium/Vit D  Signed: Alphonsa Overall, M.D., [facility] Surgery Office:  [phone]  12/26/2013, 7:47 AM

## 2013-12-26 NOTE — Discharge Summary (Signed)
Discharge instructions were provided to the patient including incision care, follow up appointment. Prescriptions were given to the patient. The patient was directed to notify the physician with any complications. Patient verbalized understanding of discharge instructions.

## 2013-12-26 NOTE — Progress Notes (Signed)
Pt d/c'd via wheelchair by RN.  Pt to home with family member.

## 2013-12-26 NOTE — Discharge Instructions (Signed)
[facility] - DISCHARGE INSTRUCTIONS TO PATIENT  Activity:  Driving - May drive in 2 or 3 days   Lifting - Take it easy for a few days, then no limit  Wound Care:   Leave bandage until tomorrow, then may remove and shower.  Leave steristips on.  Diet:  As tolerated.  Follow up appointment:  Call Dr. Pollie Friar office [facility]) at [phone] for an appointment in 2 to 3 weeks.           We will make an appointment for you with an endocrinologist.  Medications and dosages:  Resume your home medications.  You have a prescription for:  Vicodin and Calcium/Vit D  Call Dr. Lucia Gaskins or his office  [phone]) if you have:  Temperature greater than 100.4,  Persistent nausea and vomiting,  Severe uncontrolled pain,  Redness, tenderness, or signs of infection (pain, swelling, redness, odor or green/yellow discharge around the site),  Difficulty breathing, headache or visual disturbances,  Any other questions or concerns you may have after discharge.  In an emergency, call 911 or go to an Emergency Department at a nearby hospital.

## 2013-12-26 NOTE — Telephone Encounter (Signed)
Celesta Gentile, friend of pt, helping her and they cannot locate calcium gluconate, as ordered on Rx at discharge from thyroid surgery.  After discussion with Dr. Lucia Gaskins, called [facility] and had long talk with pharmachist, Juliann Pulse.  What Dr. Lucia Gaskins wants her to have is Caltrate (calcium with Vit D) and folic acid, all of which is available OTC.  She can use her HSA to pay for it if she has one.  Gave all information to pt and they will to to [facility] to get it tonight.

## 2013-12-26 NOTE — Anesthesia Postprocedure Evaluation (Signed)
  Anesthesia Post-op Note  Patient: Toni Ramirez  Procedure(s) Performed: Procedure(s) (LRB): COMPLETION THYROIDECTOMY (N/A)  Patient Location: PACU  Anesthesia Type: General  Level of Consciousness: awake and alert   Airway and Oxygen Therapy: Patient Spontanous Breathing  Post-op Pain: mild  Post-op Assessment: Post-op Vital signs reviewed, Patient's Cardiovascular Status Stable, Respiratory Function Stable, Patent Airway and No signs of Nausea or vomiting  Last Vitals:  Filed Vitals:   12/26/13 0605  BP: 118/79  Pulse: 89  Temp: 36.5 C  Resp: 18    Post-op Vital Signs: stable   Complications: No apparent anesthesia complications

## 2014-01-02 LAB — CALCIUM: CALCIUM: 9.5 mg/dL (ref 8.4–10.5)

## 2014-01-06 ENCOUNTER — Ambulatory Visit: Payer: BC Managed Care – PPO | Admitting: Internal Medicine

## 2014-01-06 ENCOUNTER — Encounter (INDEPENDENT_AMBULATORY_CARE_PROVIDER_SITE_OTHER): Payer: Self-pay | Admitting: Surgery

## 2014-01-08 ENCOUNTER — Ambulatory Visit (INDEPENDENT_AMBULATORY_CARE_PROVIDER_SITE_OTHER): Payer: BC Managed Care – PPO | Admitting: Surgery

## 2014-01-08 ENCOUNTER — Encounter (INDEPENDENT_AMBULATORY_CARE_PROVIDER_SITE_OTHER): Payer: Self-pay | Admitting: Surgery

## 2014-01-08 VITALS — BP 124/74 | HR 76 | Temp 98.0°F | Resp 18 | Ht 65.75 in | Wt 195.0 lb

## 2014-01-08 DIAGNOSIS — C73 Malignant neoplasm of thyroid gland: Secondary | ICD-10-CM

## 2014-01-08 NOTE — Progress Notes (Signed)
Re:   Toni Ramirez DOB:   09-02-1956 MRN:   [id number]  ASSESSMENT AND PLAN: 1.  Right thyroid cancer, follicular variant of papillary carcinoma (6 cm)  Right lobectomy - 12/20/2013.  Completion left lobectomy - 12/25/2013  We are making arrangements for her to See Dr. Cyd Silence for endocrinology.  She'll see me back in 6 months to just make sure all is going well.  2.  DJD of knee - doing fairly well. 3.  Has chest cold/bronchitis for about 3 weeks - no fever  Chief Complaint  Patient presents with  . Routine Post Op    lobectomy   REFERRING PHYSICIAN: Scarlette Calico, MD  HISTORY OF PRESENT ILLNESS: Toni Ramirez is a 58 y.o. (DOB: 01-22-1956)  white  female whose primary care physician is Scarlette Calico, MD and comes to me today for follow up of right thyroid cancer. She has had a total thyroidectomy (in 2 steps).  She still has some phlegm that catches in her throat, but her voice sounds good. She is on 100 mcgm of synthroid daily for now. I gave her a copy of her last path report.  History of right thyroid cancer: She did not notice anything, but went to Dr. Ronnald Ramp over the holidays for a persistant cold.  He noticed a right thyroid mass and started a work up of the mass.  She has had no prior head or neck irradiation.  She has no family history of thyroid disease.  She's had no prior neck surgery.  [place] - 11/13/2013 - Thyromegaly with dominant 7.4 cm right mass. Findings meet consensus criteria for biopsy. TSH - 0.82 0 11/13/2013   Past Medical History  Diagnosis Date  . Arthritis   . Anxiety   . Anginal pain     "because of anxiety"- none significant episode  . Cancer     thyroid cancer dx. 12-19-13,further surgery planned    Past Surgical History  Procedure Laterality Date  . Tonsillectomy    . Dilation and curettage of uterus  1994, 2003  . Thyroid lobectomy Right 12/20/2013    Procedure: RIGHT THYROID LOBECTOMY;  Surgeon: Shann Medal, MD;  Location: [facility];  Service:  General;  Laterality: Right;  . Thyroidectomy N/A 12/25/2013    Procedure: COMPLETION THYROIDECTOMY;  Surgeon: Shann Medal, MD;  Location: [facility];  Service: General;  Laterality: N/A;   Current Outpatient Prescriptions  Medication Sig Dispense Refill  . Ca [id number] (CALCIUM-FOLIC ACID PLUS D) 6761-9 MG WAFR Take 1 tablet by mouth 2 (two) times daily.  60 each  0  . Ca [id number] (CALCIUM-FOLIC ACID PLUS D) 2505-3 MG WAFR Take 1 tablet by mouth 2 (two) times daily.  60 each  0  . levothyroxine (SYNTHROID) 100 MCG tablet Take 1 tablet (100 mcg total) by mouth daily before breakfast.  30 tablet  1   No current facility-administered medications for this visit.    No Known Allergies  REVIEW OF SYSTEMS: Pulmonary:  Has 2-3 weeks cold/bronchitis before seeing me originally.  This has resolved. Gastrointestinal:  No history of stomach disease.  No history of liver disease.  No history of gall bladder disease.  No history of pancreas disease.  Colonoscopy by Dr. Collene Mares - 2010.  SOCIAL and FAMILY HISTORY: Husband died of a MI about 3 years ago. She has 3 children:  2 boys - 25 and 68.  And daughter, Jarrett Soho, 75, a junior at [employer]. 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  She worked with [employer] at one time. She was a [employer] at the same time I was.  Her maiden name was Sharlene Dory.  She was 1 year behind me in school and went to [employer].  She moved away her senior year.  She mentioned Derald Macleod and Cleophus Molt.  PHYSICAL EXAM: BP 124/74  Pulse 76  Temp(Src) 98 F (36.7 C) (Oral)  Resp 18  Ht 5' 5.75" (1.67 m)  Wt 195 lb (88.451 kg)  BMI 31.72 kg/m2  LMP 06/14/2010  General: WN WF who is alert and generally healthy appearing.  HEENT: Normal. Pupils equal. Neck: Supple. Scar doing well.  I removed the remainder of the Steristrips. Lymph Nodes:  No supraclavicular or cervical nodes.  DATA REVIEWED: Last path report was given to the  patient.  Alphonsa Overall, MD,  [facility], [address]  [place], [place]    [place] Phone:  [phone] FAX:  [phone]

## 2014-01-16 ENCOUNTER — Telehealth (INDEPENDENT_AMBULATORY_CARE_PROVIDER_SITE_OTHER): Payer: Self-pay | Admitting: *Deleted

## 2014-01-16 NOTE — Telephone Encounter (Signed)
Patient called to for an update with getting into see Dr. Nino Glow office.  Patient states she has not heard anything.  Explained that I will send a message to [facility] to ask her to check on this but it will probably be Monday before Glenda can look into it.  Patient states understanding and agreeable at this time.

## 2014-02-04 ENCOUNTER — Other Ambulatory Visit (HOSPITAL_COMMUNITY): Payer: Self-pay | Admitting: Endocrinology

## 2014-02-04 DIAGNOSIS — C73 Malignant neoplasm of thyroid gland: Secondary | ICD-10-CM

## 2014-02-24 ENCOUNTER — Encounter (HOSPITAL_COMMUNITY)
Admission: RE | Admit: 2014-02-24 | Discharge: 2014-02-24 | Disposition: A | Discharge status: Home / Self Care | Payer: BC Managed Care – PPO | Source: Ambulatory Visit | Attending: Endocrinology | Admitting: Endocrinology

## 2014-02-24 DIAGNOSIS — C73 Malignant neoplasm of thyroid gland: Secondary | ICD-10-CM

## 2014-02-24 DIAGNOSIS — E079 Disorder of thyroid, unspecified: Secondary | ICD-10-CM | POA: Insufficient documentation

## 2014-02-24 DIAGNOSIS — M899 Disorder of bone, unspecified: Secondary | ICD-10-CM | POA: Insufficient documentation

## 2014-02-24 DIAGNOSIS — M949 Disorder of cartilage, unspecified: Secondary | ICD-10-CM

## 2014-02-24 MED ORDER — THYROTROPIN ALFA 1.1 MG IM SOLR
0.9000 mg | INTRAMUSCULAR | Status: AC
  Administered 2014-02-24: 0.9 mg via INTRAMUSCULAR
  Filled 2014-02-24: qty 0.9

## 2014-02-25 ENCOUNTER — Encounter (HOSPITAL_COMMUNITY)
Admission: RE | Admit: 2014-02-25 | Discharge: 2014-02-25 | Disposition: A | Discharge status: Home / Self Care | Payer: BC Managed Care – PPO | Source: Ambulatory Visit | Attending: Endocrinology | Admitting: Endocrinology

## 2014-02-25 MED ORDER — THYROTROPIN ALFA 1.1 MG IM SOLR
0.9000 mg | INTRAMUSCULAR | Status: AC
  Administered 2014-02-25: 0.9 mg via INTRAMUSCULAR
  Filled 2014-02-25: qty 0.9

## 2014-02-26 ENCOUNTER — Encounter (HOSPITAL_COMMUNITY)
Admission: RE | Admit: 2014-02-26 | Discharge: 2014-02-26 | Disposition: A | Discharge status: Home / Self Care | Payer: BC Managed Care – PPO | Source: Ambulatory Visit | Attending: Endocrinology | Admitting: Endocrinology

## 2014-02-26 MED ORDER — SODIUM IODIDE I 131 CAPSULE
102.0000 | Freq: Once | INTRAVENOUS | Status: AC | PRN
Start: 2014-02-26 — End: 2014-02-26
  Administered 2014-02-26: 102 via ORAL

## 2014-03-05 ENCOUNTER — Encounter (HOSPITAL_COMMUNITY)
Admission: RE | Admit: 2014-03-05 | Discharge: 2014-03-05 | Disposition: A | Discharge status: Home / Self Care | Payer: BC Managed Care – PPO | Source: Ambulatory Visit | Attending: Endocrinology | Admitting: Endocrinology

## 2014-03-05 DIAGNOSIS — C73 Malignant neoplasm of thyroid gland: Secondary | ICD-10-CM

## 2014-06-17 ENCOUNTER — Encounter (INDEPENDENT_AMBULATORY_CARE_PROVIDER_SITE_OTHER): Payer: Self-pay | Admitting: Surgery

## 2014-08-13 ENCOUNTER — Encounter (INDEPENDENT_AMBULATORY_CARE_PROVIDER_SITE_OTHER): Payer: BC Managed Care – PPO | Admitting: Surgery

## 2015-03-06 ENCOUNTER — Encounter: Payer: Self-pay | Admitting: Internal Medicine

## 2015-03-06 ENCOUNTER — Ambulatory Visit (INDEPENDENT_AMBULATORY_CARE_PROVIDER_SITE_OTHER): Payer: BLUE CROSS/BLUE SHIELD | Admitting: Internal Medicine

## 2015-03-06 VITALS — BP 128/86 | HR 89 | Temp 98.5°F | Ht 65.75 in | Wt 199.0 lb

## 2015-03-06 DIAGNOSIS — R059 Cough, unspecified: Secondary | ICD-10-CM

## 2015-03-06 DIAGNOSIS — R05 Cough: Secondary | ICD-10-CM

## 2015-03-06 MED ORDER — HYDROCODONE-HOMATROPINE 5-1.5 MG/5ML PO SYRP: 5.0000 mL | ORAL_SOLUTION | Freq: Four times a day (QID) | ORAL | Status: DC | PRN

## 2015-03-06 MED ORDER — LEVOFLOXACIN 250 MG PO TABS
250.0000 mg | ORAL_TABLET | Freq: Every day | ORAL | Status: DC
Start: 2015-03-06 — End: 2015-06-08

## 2015-03-06 NOTE — Progress Notes (Signed)
Pre visit review using our clinic review tool, if applicable. No additional management support is needed unless otherwise documented below in the visit note. 

## 2015-03-06 NOTE — Assessment & Plan Note (Signed)
Likely infectious, ? Bronchitis vs pna , declines cxr, Mild to mod, for antibx course, cough med prn, to f/u any worsening symptoms or concerns

## 2015-03-06 NOTE — Progress Notes (Signed)
   Subjective:    Patient ID: Toni Ramirez, female    DOB: July 26, 1956, 59 y.o.   MRN: [id number]  HPI  Here with acute onset mild to mod 2-3 days ST, HA, general weakness and malaise, with prod cough greenish sputum, but Pt denies chest pain, increased sob or doe, wheezing, orthopnea, PND, increased LE swelling, palpitations, dizziness or syncope.   Past Medical History  Diagnosis Date  . Arthritis   . Anxiety   . Anginal pain     "because of anxiety"- none significant episode  . Cancer     thyroid cancer dx. 12-19-13,further surgery planned   Past Surgical History  Procedure Laterality Date  . Tonsillectomy    . Dilation and curettage of uterus  1994, 2003  . Thyroid lobectomy Right 12/20/2013    Procedure: RIGHT THYROID LOBECTOMY;  Surgeon: Shann Medal, MD;  Location: [facility];  Service: General;  Laterality: Right;  . Thyroidectomy N/A 12/25/2013    Procedure: COMPLETION THYROIDECTOMY;  Surgeon: Shann Medal, MD;  Location: [facility];  Service: General;  Laterality: N/A;    reports that she has never smoked. She has never used smokeless tobacco. She reports that she drinks alcohol. She reports that she does not use illicit drugs. family history includes Breast cancer in her mother; Cancer in her mother. There is no history of Alcohol abuse, Depression, Diabetes, Arthritis, Heart disease, Hyperlipidemia, Hypertension, Kidney disease, or Stroke. No Known Allergies Current Outpatient Prescriptions on File Prior to Visit  Medication Sig Dispense Refill  . levothyroxine (SYNTHROID) 100 MCG tablet Take 1 tablet (100 mcg total) by mouth daily before breakfast. 30 tablet 1   No current facility-administered medications on file prior to visit.   Review of Systems All otherwise neg per pt     Objective:   Physical Exam BP 128/86 mmHg  Pulse 89  Temp(Src) 98.5 F (36.9 C) (Oral)  Ht 5' 5.75" (1.67 m)  Wt 199 lb (90.266 kg)  BMI 32.37 kg/m2  SpO2 94%  LMP 06/14/2010 VS noted, mild  ill Constitutional: Pt appears in no significant distress HENT: Head: NCAT.  Right Ear: External ear normal.  Left Ear: External ear normal.  Bilat tm's with mild erythema.  Max sinus areas non tender.  Pharynx with mild erythema, no exudate Eyes: . Pupils are equal, round, and reactive to light. Conjunctivae and EOM are normal Neck: Normal range of motion. Neck supple.  Cardiovascular: Normal rate and regular rhythm.   Pulmonary/Chest: Effort normal and breath sounds without rales or wheezing.  Neurological: Pt is alert. Not confused , motor grossly intact Skin: Skin is warm. No rash, no LE edema Psychiatric: Pt behavior is normal. No agitation.      Assessment & Plan:

## 2015-03-06 NOTE — Patient Instructions (Signed)
Please take all new medication as prescribed - the antibiotic, and cough medicine  Please continue all other medications as before, and refills have been done if requested.  Please have the pharmacy call with any other refills you may need.  Please keep your appointments with your specialists as you may have planned      

## 2015-06-08 ENCOUNTER — Ambulatory Visit (INDEPENDENT_AMBULATORY_CARE_PROVIDER_SITE_OTHER): Payer: BLUE CROSS/BLUE SHIELD | Admitting: Internal Medicine

## 2015-06-08 ENCOUNTER — Encounter: Payer: Self-pay | Admitting: Internal Medicine

## 2015-06-08 VITALS — BP 110/80 | HR 74 | Temp 98.6°F | Resp 16 | Ht 65.0 in | Wt 196.5 lb

## 2015-06-08 DIAGNOSIS — F40243 Fear of flying: Secondary | ICD-10-CM

## 2015-06-08 DIAGNOSIS — Z0000 Encounter for general adult medical examination without abnormal findings: Secondary | ICD-10-CM | POA: Insufficient documentation

## 2015-06-08 DIAGNOSIS — R739 Hyperglycemia, unspecified: Secondary | ICD-10-CM

## 2015-06-08 HISTORY — DX: Fear of flying: F40.243

## 2015-06-08 MED ORDER — ALPRAZOLAM 0.5 MG PO TABS: 0.5000 mg | ORAL_TABLET | Freq: Every evening | ORAL | Status: DC | PRN

## 2015-06-08 NOTE — Progress Notes (Signed)
Pre visit review using our clinic review tool, if applicable. No additional management support is needed unless otherwise documented below in the visit note. 

## 2015-06-08 NOTE — Patient Instructions (Signed)
Preventive Care for Adults A healthy lifestyle and preventive care can promote health and wellness. Preventive health guidelines for women include the following key practices.  A routine yearly physical is a good way to check with your health care provider about your health and preventive screening. It is a chance to share any concerns and updates on your health and to receive a thorough exam.  Visit your dentist for a routine exam and preventive care every 6 months. Brush your teeth twice a day and floss once a day. Good oral hygiene prevents tooth decay and gum disease.  The frequency of eye exams is based on your age, health, family medical history, use of contact lenses, and other factors. Follow your health care provider's recommendations for frequency of eye exams.  Eat a healthy diet. Foods like vegetables, fruits, whole grains, low-fat dairy products, and lean protein foods contain the nutrients you need without too many calories. Decrease your intake of foods high in solid fats, added sugars, and salt. Eat the right amount of calories for you.Get information about a proper diet from your health care provider, if necessary.  Regular physical exercise is one of the most important things you can do for your health. Most adults should get at least 150 minutes of moderate-intensity exercise (any activity that increases your heart rate and causes you to sweat) each week. In addition, most adults need muscle-strengthening exercises on 2 or more days a week.  Maintain a healthy weight. The body mass index (BMI) is a screening tool to identify possible weight problems. It provides an estimate of body fat based on height and weight. Your health care provider can find your BMI and can help you achieve or maintain a healthy weight.For adults 20 years and older:  A BMI below 18.5 is considered underweight.  A BMI of 18.5 to 24.9 is normal.  A BMI of 25 to 29.9 is considered overweight.  A BMI of  30 and above is considered obese.  Maintain normal blood lipids and cholesterol levels by exercising and minimizing your intake of saturated fat. Eat a balanced diet with plenty of fruit and vegetables. Blood tests for lipids and cholesterol should begin at age 76 and be repeated every 5 years. If your lipid or cholesterol levels are high, you are over 50, or you are at high risk for heart disease, you may need your cholesterol levels checked more frequently.Ongoing high lipid and cholesterol levels should be treated with medicines if diet and exercise are not working.  If you smoke, find out from your health care provider how to quit. If you do not use tobacco, do not start.  Lung cancer screening is recommended for adults aged 22-80 years who are at high risk for developing lung cancer because of a history of smoking. A yearly low-dose CT scan of the lungs is recommended for people who have at least a 30-pack-year history of smoking and are a current smoker or have quit within the past 15 years. A pack year of smoking is smoking an average of 1 pack of cigarettes a day for 1 year (for example: 1 pack a day for 30 years or 2 packs a day for 15 years). Yearly screening should continue until the smoker has stopped smoking for at least 15 years. Yearly screening should be stopped for people who develop a health problem that would prevent them from having lung cancer treatment.  If you are pregnant, do not drink alcohol. If you are breastfeeding,  be very cautious about drinking alcohol. If you are not pregnant and choose to drink alcohol, do not have more than 1 drink per day. One drink is considered to be 12 ounces (355 mL) of beer, 5 ounces (148 mL) of wine, or 1.5 ounces (44 mL) of liquor.  Avoid use of street drugs. Do not share needles with anyone. Ask for help if you need support or instructions about stopping the use of drugs.  High blood pressure causes heart disease and increases the risk of  stroke. Your blood pressure should be checked at least every 1 to 2 years. Ongoing high blood pressure should be treated with medicines if weight loss and exercise do not work.  If you are 75-52 years old, ask your health care provider if you should take aspirin to prevent strokes.  Diabetes screening involves taking a blood sample to check your fasting blood sugar level. This should be done once every 3 years, after age 15, if you are within normal weight and without risk factors for diabetes. Testing should be considered at a younger age or be carried out more frequently if you are overweight and have at least 1 risk factor for diabetes.  Breast cancer screening is essential preventive care for women. You should practice "breast self-awareness." This means understanding the normal appearance and feel of your breasts and may include breast self-examination. Any changes detected, no matter how small, should be reported to a health care provider. Women in their 58s and 30s should have a clinical breast exam (CBE) by a health care provider as part of a regular health exam every 1 to 3 years. After age 16, women should have a CBE every year. Starting at age 53, women should consider having a mammogram (breast X-ray test) every year. Women who have a family history of breast cancer should talk to their health care provider about genetic screening. Women at a high risk of breast cancer should talk to their health care providers about having an MRI and a mammogram every year.  Breast cancer gene (BRCA)-related cancer risk assessment is recommended for women who have family members with BRCA-related cancers. BRCA-related cancers include breast, ovarian, tubal, and peritoneal cancers. Having family members with these cancers may be associated with an increased risk for harmful changes (mutations) in the breast cancer genes BRCA1 and BRCA2. Results of the assessment will determine the need for genetic counseling and  BRCA1 and BRCA2 testing.  Routine pelvic exams to screen for cancer are no longer recommended for nonpregnant women who are considered low risk for cancer of the pelvic organs (ovaries, uterus, and vagina) and who do not have symptoms. Ask your health care provider if a screening pelvic exam is right for you.  If you have had past treatment for cervical cancer or a condition that could lead to cancer, you need Pap tests and screening for cancer for at least 20 years after your treatment. If Pap tests have been discontinued, your risk factors (such as having a new sexual partner) need to be reassessed to determine if screening should be resumed. Some women have medical problems that increase the chance of getting cervical cancer. In these cases, your health care provider may recommend more frequent screening and Pap tests.  The HPV test is an additional test that may be used for cervical cancer screening. The HPV test looks for the virus that can cause the cell changes on the cervix. The cells collected during the Pap test can be  tested for HPV. The HPV test could be used to screen women aged 30 years and older, and should be used in women of any age who have unclear Pap test results. After the age of 30, women should have HPV testing at the same frequency as a Pap test.  Colorectal cancer can be detected and often prevented. Most routine colorectal cancer screening begins at the age of 50 years and continues through age 75 years. However, your health care provider may recommend screening at an earlier age if you have risk factors for colon cancer. On a yearly basis, your health care provider may provide home test kits to check for hidden blood in the stool. Use of a small camera at the end of a tube, to directly examine the colon (sigmoidoscopy or colonoscopy), can detect the earliest forms of colorectal cancer. Talk to your health care provider about this at age 50, when routine screening begins. Direct  exam of the colon should be repeated every 5-10 years through age 75 years, unless early forms of pre-cancerous polyps or small growths are found.  People who are at an increased risk for hepatitis B should be screened for this virus. You are considered at high risk for hepatitis B if:  You were born in a country where hepatitis B occurs often. Talk with your health care provider about which countries are considered high risk.  Your parents were born in a high-risk country and you have not received a shot to protect against hepatitis B (hepatitis B vaccine).  You have HIV or AIDS.  You use needles to inject street drugs.  You live with, or have sex with, someone who has hepatitis B.  You get hemodialysis treatment.  You take certain medicines for conditions like cancer, organ transplantation, and autoimmune conditions.  Hepatitis C blood testing is recommended for all people born from 1945 through 1965 and any individual with known risks for hepatitis C.  Practice safe sex. Use condoms and avoid high-risk sexual practices to reduce the spread of sexually transmitted infections (STIs). STIs include gonorrhea, chlamydia, syphilis, trichomonas, herpes, HPV, and human immunodeficiency virus (HIV). Herpes, HIV, and HPV are viral illnesses that have no cure. They can result in disability, cancer, and death.  You should be screened for sexually transmitted illnesses (STIs) including gonorrhea and chlamydia if:  You are sexually active and are younger than 24 years.  You are older than 24 years and your health care provider tells you that you are at risk for this type of infection.  Your sexual activity has changed since you were last screened and you are at an increased risk for chlamydia or gonorrhea. Ask your health care provider if you are at risk.  If you are at risk of being infected with HIV, it is recommended that you take a prescription medicine daily to prevent HIV infection. This is  called preexposure prophylaxis (PrEP). You are considered at risk if:  You are a heterosexual woman, are sexually active, and are at increased risk for HIV infection.  You take drugs by injection.  You are sexually active with a partner who has HIV.  Talk with your health care provider about whether you are at high risk of being infected with HIV. If you choose to begin PrEP, you should first be tested for HIV. You should then be tested every 3 months for as long as you are taking PrEP.  Osteoporosis is a disease in which the bones lose minerals and strength   with aging. This can result in serious bone fractures or breaks. The risk of osteoporosis can be identified using a bone density scan. Women ages 65 years and over and women at risk for fractures or osteoporosis should discuss screening with their health care providers. Ask your health care provider whether you should take a calcium supplement or vitamin D to reduce the rate of osteoporosis.  Menopause can be associated with physical symptoms and risks. Hormone replacement therapy is available to decrease symptoms and risks. You should talk to your health care provider about whether hormone replacement therapy is right for you.  Use sunscreen. Apply sunscreen liberally and repeatedly throughout the day. You should seek shade when your shadow is shorter than you. Protect yourself by wearing long sleeves, pants, a wide-brimmed hat, and sunglasses year round, whenever you are outdoors.  Once a month, do a whole body skin exam, using a mirror to look at the skin on your back. Tell your health care provider of new moles, moles that have irregular borders, moles that are larger than a pencil eraser, or moles that have changed in shape or color.  Stay current with required vaccines (immunizations).  Influenza vaccine. All adults should be immunized every year.  Tetanus, diphtheria, and acellular pertussis (Td, Tdap) vaccine. Pregnant women should  receive 1 dose of Tdap vaccine during each pregnancy. The dose should be obtained regardless of the length of time since the last dose. Immunization is preferred during the 27th-36th week of gestation. An adult who has not previously received Tdap or who does not know her vaccine status should receive 1 dose of Tdap. This initial dose should be followed by tetanus and diphtheria toxoids (Td) booster doses every 10 years. Adults with an unknown or incomplete history of completing a 3-dose immunization series with Td-containing vaccines should begin or complete a primary immunization series including a Tdap dose. Adults should receive a Td booster every 10 years.  Varicella vaccine. An adult without evidence of immunity to varicella should receive 2 doses or a second dose if she has previously received 1 dose. Pregnant females who do not have evidence of immunity should receive the first dose after pregnancy. This first dose should be obtained before leaving the health care facility. The second dose should be obtained 4-8 weeks after the first dose.  Human papillomavirus (HPV) vaccine. Females aged 13-26 years who have not received the vaccine previously should obtain the 3-dose series. The vaccine is not recommended for use in pregnant females. However, pregnancy testing is not needed before receiving a dose. If a female is found to be pregnant after receiving a dose, no treatment is needed. In that case, the remaining doses should be delayed until after the pregnancy. Immunization is recommended for any person with an immunocompromised condition through the age of 26 years if she did not get any or all doses earlier. During the 3-dose series, the second dose should be obtained 4-8 weeks after the first dose. The third dose should be obtained 24 weeks after the first dose and 16 weeks after the second dose.  Zoster vaccine. One dose is recommended for adults aged 60 years or older unless certain conditions are  present.  Measles, mumps, and rubella (MMR) vaccine. Adults born before 1957 generally are considered immune to measles and mumps. Adults born in 1957 or later should have 1 or more doses of MMR vaccine unless there is a contraindication to the vaccine or there is laboratory evidence of immunity to   each of the three diseases. A routine second dose of MMR vaccine should be obtained at least 28 days after the first dose for students attending postsecondary schools, health care workers, or international travelers. People who received inactivated measles vaccine or an unknown type of measles vaccine during 1963-1967 should receive 2 doses of MMR vaccine. People who received inactivated mumps vaccine or an unknown type of mumps vaccine before 1979 and are at high risk for mumps infection should consider immunization with 2 doses of MMR vaccine. For females of childbearing age, rubella immunity should be determined. If there is no evidence of immunity, females who are not pregnant should be vaccinated. If there is no evidence of immunity, females who are pregnant should delay immunization until after pregnancy. Unvaccinated health care workers born before 1957 who lack laboratory evidence of measles, mumps, or rubella immunity or laboratory confirmation of disease should consider measles and mumps immunization with 2 doses of MMR vaccine or rubella immunization with 1 dose of MMR vaccine.  Pneumococcal 13-valent conjugate (PCV13) vaccine. When indicated, a person who is uncertain of her immunization history and has no record of immunization should receive the PCV13 vaccine. An adult aged 19 years or older who has certain medical conditions and has not been previously immunized should receive 1 dose of PCV13 vaccine. This PCV13 should be followed with a dose of pneumococcal polysaccharide (PPSV23) vaccine. The PPSV23 vaccine dose should be obtained at least 8 weeks after the dose of PCV13 vaccine. An adult aged 19  years or older who has certain medical conditions and previously received 1 or more doses of PPSV23 vaccine should receive 1 dose of PCV13. The PCV13 vaccine dose should be obtained 1 or more years after the last PPSV23 vaccine dose.  Pneumococcal polysaccharide (PPSV23) vaccine. When PCV13 is also indicated, PCV13 should be obtained first. All adults aged 65 years and older should be immunized. An adult younger than age 65 years who has certain medical conditions should be immunized. Any person who resides in a nursing home or long-term care facility should be immunized. An adult smoker should be immunized. People with an immunocompromised condition and certain other conditions should receive both PCV13 and PPSV23 vaccines. People with human immunodeficiency virus (HIV) infection should be immunized as soon as possible after diagnosis. Immunization during chemotherapy or radiation therapy should be avoided. Routine use of PPSV23 vaccine is not recommended for American Indians, [place] Natives, or people younger than 65 years unless there are medical conditions that require PPSV23 vaccine. When indicated, people who have unknown immunization and have no record of immunization should receive PPSV23 vaccine. One-time revaccination 5 years after the first dose of PPSV23 is recommended for people aged 19-64 years who have chronic kidney failure, nephrotic syndrome, asplenia, or immunocompromised conditions. People who received 1-2 doses of PPSV23 before age 65 years should receive another dose of PPSV23 vaccine at age 65 years or later if at least 5 years have passed since the previous dose. Doses of PPSV23 are not needed for people immunized with PPSV23 at or after age 65 years.  Meningococcal vaccine. Adults with asplenia or persistent complement component deficiencies should receive 2 doses of quadrivalent meningococcal conjugate (MenACWY-D) vaccine. The doses should be obtained at least 2 months apart.  Microbiologists working with certain meningococcal bacteria, military recruits, people at risk during an outbreak, and people who travel to or live in countries with a high rate of meningitis should be immunized. A first-year college student up through age   21 years who is living in a residence hall should receive a dose if she did not receive a dose on or after her 16th birthday. Adults who have certain high-risk conditions should receive one or more doses of vaccine.  Hepatitis A vaccine. Adults who wish to be protected from this disease, have certain high-risk conditions, work with hepatitis A-infected animals, work in hepatitis A research labs, or travel to or work in countries with a high rate of hepatitis A should be immunized. Adults who were previously unvaccinated and who anticipate close contact with an international adoptee during the first 60 days after arrival in the [place] from a country with a high rate of hepatitis A should be immunized.  Hepatitis B vaccine. Adults who wish to be protected from this disease, have certain high-risk conditions, may be exposed to blood or other infectious body fluids, are household contacts or sex partners of hepatitis B positive people, are clients or workers in certain care facilities, or travel to or work in countries with a high rate of hepatitis B should be immunized.  Haemophilus influenzae type b (Hib) vaccine. A previously unvaccinated person with asplenia or sickle cell disease or having a scheduled splenectomy should receive 1 dose of Hib vaccine. Regardless of previous immunization, a recipient of a hematopoietic stem cell transplant should receive a 3-dose series 6-12 months after her successful transplant. Hib vaccine is not recommended for adults with HIV infection. Preventive Services / Frequency Ages 64 to 68 years  Blood pressure check.** / Every 1 to 2 years.  Lipid and cholesterol check.** / Every 5 years beginning at age  22.  Clinical breast exam.** / Every 3 years for women in their 88s and 53s.  BRCA-related cancer risk assessment.** / For women who have family members with a BRCA-related cancer (breast, ovarian, tubal, or peritoneal cancers).  Pap test.** / Every 2 years from ages 90 through 51. Every 3 years starting at age 21 through age 56 or 3 with a history of 3 consecutive normal Pap tests.  HPV screening.** / Every 3 years from ages 24 through ages 1 to 46 with a history of 3 consecutive normal Pap tests.  Hepatitis C blood test.** / For any individual with known risks for hepatitis C.  Skin self-exam. / Monthly.  Influenza vaccine. / Every year.  Tetanus, diphtheria, and acellular pertussis (Tdap, Td) vaccine.** / Consult your health care provider. Pregnant women should receive 1 dose of Tdap vaccine during each pregnancy. 1 dose of Td every 10 years.  Varicella vaccine.** / Consult your health care provider. Pregnant females who do not have evidence of immunity should receive the first dose after pregnancy.  HPV vaccine. / 3 doses over 6 months, if 72 and younger. The vaccine is not recommended for use in pregnant females. However, pregnancy testing is not needed before receiving a dose.  Measles, mumps, rubella (MMR) vaccine.** / You need at least 1 dose of MMR if you were born in 1957 or later. You may also need a 2nd dose. For females of childbearing age, rubella immunity should be determined. If there is no evidence of immunity, females who are not pregnant should be vaccinated. If there is no evidence of immunity, females who are pregnant should delay immunization until after pregnancy.  Pneumococcal 13-valent conjugate (PCV13) vaccine.** / Consult your health care provider.  Pneumococcal polysaccharide (PPSV23) vaccine.** / 1 to 2 doses if you smoke cigarettes or if you have certain conditions.  Meningococcal vaccine.** /  1 dose if you are age 19 to 21 years and a first-year college  student living in a residence hall, or have one of several medical conditions, you need to get vaccinated against meningococcal disease. You may also need additional booster doses.  Hepatitis A vaccine.** / Consult your health care provider.  Hepatitis B vaccine.** / Consult your health care provider.  Haemophilus influenzae type b (Hib) vaccine.** / Consult your health care provider. Ages 40 to 64 years  Blood pressure check.** / Every 1 to 2 years.  Lipid and cholesterol check.** / Every 5 years beginning at age 20 years.  Lung cancer screening. / Every year if you are aged 55-80 years and have a 30-pack-year history of smoking and currently smoke or have quit within the past 15 years. Yearly screening is stopped once you have quit smoking for at least 15 years or develop a health problem that would prevent you from having lung cancer treatment.  Clinical breast exam.** / Every year after age 40 years.  BRCA-related cancer risk assessment.** / For women who have family members with a BRCA-related cancer (breast, ovarian, tubal, or peritoneal cancers).  Mammogram.** / Every year beginning at age 40 years and continuing for as long as you are in good health. Consult with your health care provider.  Pap test.** / Every 3 years starting at age 30 years through age 65 or 70 years with a history of 3 consecutive normal Pap tests.  HPV screening.** / Every 3 years from ages 30 years through ages 65 to 70 years with a history of 3 consecutive normal Pap tests.  Fecal occult blood test (FOBT) of stool. / Every year beginning at age 50 years and continuing until age 75 years. You may not need to do this test if you get a colonoscopy every 10 years.  Flexible sigmoidoscopy or colonoscopy.** / Every 5 years for a flexible sigmoidoscopy or every 10 years for a colonoscopy beginning at age 50 years and continuing until age 75 years.  Hepatitis C blood test.** / For all people born from 1945 through  1965 and any individual with known risks for hepatitis C.  Skin self-exam. / Monthly.  Influenza vaccine. / Every year.  Tetanus, diphtheria, and acellular pertussis (Tdap/Td) vaccine.** / Consult your health care provider. Pregnant women should receive 1 dose of Tdap vaccine during each pregnancy. 1 dose of Td every 10 years.  Varicella vaccine.** / Consult your health care provider. Pregnant females who do not have evidence of immunity should receive the first dose after pregnancy.  Zoster vaccine.** / 1 dose for adults aged 60 years or older.  Measles, mumps, rubella (MMR) vaccine.** / You need at least 1 dose of MMR if you were born in 1957 or later. You may also need a 2nd dose. For females of childbearing age, rubella immunity should be determined. If there is no evidence of immunity, females who are not pregnant should be vaccinated. If there is no evidence of immunity, females who are pregnant should delay immunization until after pregnancy.  Pneumococcal 13-valent conjugate (PCV13) vaccine.** / Consult your health care provider.  Pneumococcal polysaccharide (PPSV23) vaccine.** / 1 to 2 doses if you smoke cigarettes or if you have certain conditions.  Meningococcal vaccine.** / Consult your health care provider.  Hepatitis A vaccine.** / Consult your health care provider.  Hepatitis B vaccine.** / Consult your health care provider.  Haemophilus influenzae type b (Hib) vaccine.** / Consult your health care provider. Ages 65   years and over  Blood pressure check.** / Every 1 to 2 years.  Lipid and cholesterol check.** / Every 5 years beginning at age 22 years.  Lung cancer screening. / Every year if you are aged 73-80 years and have a 30-pack-year history of smoking and currently smoke or have quit within the past 15 years. Yearly screening is stopped once you have quit smoking for at least 15 years or develop a health problem that would prevent you from having lung cancer  treatment.  Clinical breast exam.** / Every year after age 4 years.  BRCA-related cancer risk assessment.** / For women who have family members with a BRCA-related cancer (breast, ovarian, tubal, or peritoneal cancers).  Mammogram.** / Every year beginning at age 40 years and continuing for as long as you are in good health. Consult with your health care provider.  Pap test.** / Every 3 years starting at age 9 years through age 34 or 91 years with 3 consecutive normal Pap tests. Testing can be stopped between 65 and 70 years with 3 consecutive normal Pap tests and no abnormal Pap or HPV tests in the past 10 years.  HPV screening.** / Every 3 years from ages 57 years through ages 64 or 45 years with a history of 3 consecutive normal Pap tests. Testing can be stopped between 65 and 70 years with 3 consecutive normal Pap tests and no abnormal Pap or HPV tests in the past 10 years.  Fecal occult blood test (FOBT) of stool. / Every year beginning at age 15 years and continuing until age 17 years. You may not need to do this test if you get a colonoscopy every 10 years.  Flexible sigmoidoscopy or colonoscopy.** / Every 5 years for a flexible sigmoidoscopy or every 10 years for a colonoscopy beginning at age 86 years and continuing until age 71 years.  Hepatitis C blood test.** / For all people born from [birth year] through 1966 and any individual with known risks for hepatitis C.  Osteoporosis screening.** / A one-time screening for women ages 83 years and over and women at risk for fractures or osteoporosis.  Skin self-exam. / Monthly.  Influenza vaccine. / Every year.  Tetanus, diphtheria, and acellular pertussis (Tdap/Td) vaccine.** / 1 dose of Td every 10 years.  Varicella vaccine.** / Consult your health care provider.  Zoster vaccine.** / 1 dose for adults aged 61 years or older.  Pneumococcal 13-valent conjugate (PCV13) vaccine.** / Consult your health care provider.  Pneumococcal  polysaccharide (PPSV23) vaccine.** / 1 dose for all adults aged 28 years and older.  Meningococcal vaccine.** / Consult your health care provider.  Hepatitis A vaccine.** / Consult your health care provider.  Hepatitis B vaccine.** / Consult your health care provider.  Haemophilus influenzae type b (Hib) vaccine.** / Consult your health care provider. ** Family history and personal history of risk and conditions may change your health care provider's recommendations. Document Released: 12/27/2001 Document Revised: 03/17/2014 Document Reviewed: 03/28/2011 [facility] Patient Information 2015 [place], [place]. This information is not intended to replace advice given to you by your health care provider. Make sure you discuss any questions you have with your health care provider.

## 2015-06-09 NOTE — Progress Notes (Signed)
Subjective:  Patient ID: Toni Ramirez, female    DOB: Jul 26, 1956  Age: 60 y.o. MRN: [id number]  CC: Annual Exam   HPI Toni Ramirez presents for a complete physical. She recently saw her GYN and is scheduled soon for a mammogram. She follows with Dr. Chalmers Ramirez of endocrinology regarding her hypothyroidism and she sees a surgeon about the history of thyroid cancer. Recent checkups on those areas were normal. She has a chronic fear of flying and requests a refill for prescription for alprazolam. Her elderly parents live in [place] and she flies there frequently to check on them.  Outpatient Prescriptions Prior to Visit  Medication Sig Dispense Refill  . levothyroxine (SYNTHROID) 100 MCG tablet Take 1 tablet (100 mcg total) by mouth daily before breakfast. 30 tablet 1  . HYDROcodone-homatropine (HYCODAN) 5-1.5 MG/5ML syrup Take 5 mLs by mouth every 6 (six) hours as needed for cough. (Patient not taking: Reported on 06/08/2015) 180 mL 0  . levofloxacin (LEVAQUIN) 250 MG tablet Take 1 tablet (250 mg total) by mouth daily. (Patient not taking: Reported on 06/08/2015) 10 tablet 0   No facility-administered medications prior to visit.    ROS Review of Systems  Constitutional: Negative.  Negative for fever, chills, diaphoresis, appetite change and fatigue.  HENT: Negative.   Eyes: Negative.   Respiratory: Negative.  Negative for cough, choking, chest tightness, shortness of breath and stridor.   Cardiovascular: Negative.  Negative for chest pain, palpitations and leg swelling.  Gastrointestinal: Negative.  Negative for nausea, vomiting, abdominal pain, diarrhea, constipation and blood in stool.  Endocrine: Negative.   Genitourinary: Negative.   Musculoskeletal: Negative.  Negative for neck pain.  Skin: Negative.  Negative for rash.  Allergic/Immunologic: Negative.   Neurological: Negative.  Negative for dizziness, tremors, seizures, syncope, light-headedness, numbness and headaches.    Hematological: Negative.  Negative for adenopathy. Does not bruise/bleed easily.  Psychiatric/Behavioral: Negative for suicidal ideas, sleep disturbance, dysphoric mood and decreased concentration. The patient is nervous/anxious.     Objective:  BP 110/80 mmHg  Pulse 74  Temp(Src) 98.6 F (37 C) (Oral)  Resp 16  Ht 5\' 5"  (1.651 m)  Wt 196 lb 8 oz (89.132 kg)  BMI 32.70 kg/m2  SpO2 97%  LMP 06/14/2010  BP Readings from Last 3 Encounters:  06/08/15 110/80  03/06/15 128/86  01/08/14 124/74    Wt Readings from Last 3 Encounters:  06/08/15 196 lb 8 oz (89.132 kg)  03/06/15 199 lb (90.266 kg)  01/08/14 195 lb (88.451 kg)    Physical Exam  Constitutional: She is oriented to person, place, and time. She appears well-developed and well-nourished. No distress.  HENT:  Head: Normocephalic and atraumatic.  Mouth/Throat: Oropharynx is clear and moist. No oropharyngeal exudate.  Eyes: Conjunctivae are normal. Right eye exhibits no discharge. Left eye exhibits no discharge. No scleral icterus.  Neck: Normal range of motion. Neck supple. No JVD present. No tracheal tenderness present. No tracheal deviation present. No thyroid mass and no thyromegaly present.  Cardiovascular: Normal rate, regular rhythm, normal heart sounds and intact distal pulses.  Exam reveals no gallop and no friction rub.   No murmur heard. Pulmonary/Chest: Effort normal and breath sounds normal. No stridor. No respiratory distress. She has no wheezes. She has no rales. She exhibits no tenderness.  Abdominal: Soft. Bowel sounds are normal. She exhibits no distension and no mass. There is no tenderness. There is no rebound and no guarding.  Musculoskeletal: Normal range of motion. She  exhibits no edema or tenderness.  Lymphadenopathy:    She has no cervical adenopathy.  Neurological: She is oriented to person, place, and time.  Skin: Skin is warm and dry. No rash noted. She is not diaphoretic. No erythema. No pallor.   Psychiatric: She has a normal mood and affect. Her behavior is normal. Judgment and thought content normal.  Vitals reviewed.   Lab Results  Component Value Date   WBC 6.5 12/16/2013   HGB 14.1 12/16/2013   HCT 41.4 12/16/2013   PLT 302 12/16/2013   GLUCOSE 122* 12/24/2013   ALT 22 12/24/2013   AST 18 12/24/2013   NA 140 12/24/2013   K 4.7 12/24/2013   CL 101 12/24/2013   CREATININE 0.86 12/24/2013   BUN 14 12/24/2013   CO2 28 12/24/2013   TSH 0.82 11/13/2013   HGBA1C 5.6 11/20/2013    Nm Whole Body I131 Scan S/p Ca Rx  03/05/2014   CLINICAL DATA:  Thyroid cancer, 7 days as therapy . Papillary thyroid carcinoma. 6 cm lesion with no evidence of extra capsular extension. T3 lesion.  EXAM: NUCLEAR MEDICINE I-131 POST THERAPY WHOLE BODY SCAN  TECHNIQUE: The patient received 102 mCi I-131 sodium iodide for the treatment of thyroid cancer within the past 10 days. The patient returns today, and whole body scanning was performed in the anterior and posterior projections.  COMPARISON:  Neck ultrasound 114 1,015  FINDINGS: There is uptake within the left and right aspect of the thyroid bed most consistent residual thyroid tissue. There is a single focus midline superior to the dominant uptake within thyroid bed which also likely represents residual thyroid tissue.  Physiologic uptake noted in the salivary glands GI and GU tract. No evidence distant metastasis.  IMPRESSION: 1. No evidence of metastasis all whole-body I 131 scan. 2. Three foci of uptake in thyroid bed likely represent remnant thyroid tissue   Electronically Signed   By: Suzy Bouchard M.D.   On: 03/05/2014 10:32    Assessment & Plan:   [name] was seen today for annual exam.  Diagnoses and all orders for this visit:  Hyperglycemia Orders: -     Hemoglobin A1c; Future  Routine general medical examination at a health care facility- she is scheduled for mammogram within the next few weeks, I have asked her to forward those  results to me. Exam done today, labs ordered, vaccines were reviewed, she was given patient education material. She has a repeat colonoscopy soon with Dr. Collene Ramirez regarding history of colon polyps. Orders: -     Lipid panel; Future -     Comprehensive metabolic panel; Future -     CBC with Differential/Platelet; Future -     TSH; Future  Fear of flying Orders: -     ALPRAZolam (XANAX) 0.5 MG tablet; Take 1 tablet (0.5 mg total) by mouth at bedtime as needed for anxiety.   I have discontinued Ms. Stennett levofloxacin and HYDROcodone-homatropine. I am also having her start on ALPRAZolam. Additionally, I am having her maintain her levothyroxine.  Meds ordered this encounter  Medications  . ALPRAZolam (XANAX) 0.5 MG tablet    Sig: Take 1 tablet (0.5 mg total) by mouth at bedtime as needed for anxiety.    Dispense:  30 tablet    Refill:  2     Follow-up: Return in about 1 year (around 06/07/2016).  Scarlette Calico, MD

## 2016-02-29 ENCOUNTER — Other Ambulatory Visit (HOSPITAL_COMMUNITY): Payer: Self-pay | Admitting: Endocrinology

## 2016-02-29 DIAGNOSIS — C73 Malignant neoplasm of thyroid gland: Secondary | ICD-10-CM

## 2016-03-02 ENCOUNTER — Encounter: Payer: Self-pay | Admitting: Internal Medicine

## 2016-03-02 ENCOUNTER — Ambulatory Visit (INDEPENDENT_AMBULATORY_CARE_PROVIDER_SITE_OTHER): Payer: BLUE CROSS/BLUE SHIELD | Admitting: Internal Medicine

## 2016-03-02 VITALS — BP 100/84 | HR 65 | Temp 98.2°F | Resp 16 | Ht 65.0 in | Wt 203.0 lb

## 2016-03-02 DIAGNOSIS — B029 Zoster without complications: Secondary | ICD-10-CM | POA: Diagnosis not present

## 2016-03-02 LAB — CBC AND DIFFERENTIAL
HEMATOCRIT: 41 % (ref 36–46)
HEMOGLOBIN: 13.5 g/dL (ref 12.0–16.0)
Platelets: 286 10*3/uL (ref 150–399)
WBC: 5.8 10*3/mL

## 2016-03-02 LAB — BASIC METABOLIC PANEL
BUN: 18 mg/dL (ref 4–21)
Creatinine: 0.9 mg/dL (ref 0.5–1.1)
GLUCOSE: 105 mg/dL
POTASSIUM: 4.4 mmol/L (ref 3.4–5.3)
SODIUM: 141 mmol/L (ref 137–147)

## 2016-03-02 LAB — HEPATIC FUNCTION PANEL
ALT: 9 U/L (ref 7–35)
AST: 14 U/L (ref 13–35)
Alkaline Phosphatase: 73 U/L (ref 25–125)
BILIRUBIN DIRECT: 0.13 mg/dL (ref 0.01–0.4)
Bilirubin, Total: 0.5 mg/dL

## 2016-03-02 MED ORDER — VALACYCLOVIR HCL 1 G PO TABS: 1000.0000 mg | ORAL_TABLET | Freq: Three times a day (TID) | ORAL | Status: DC

## 2016-03-02 MED ORDER — VALACYCLOVIR HCL 1 G PO TABS: 1000.0000 mg | ORAL_TABLET | Freq: Three times a day (TID) | ORAL | Status: AC

## 2016-03-02 MED ORDER — PROMETHAZINE HCL 12.5 MG PO TABS: 12.5000 mg | ORAL_TABLET | Freq: Four times a day (QID) | ORAL | Status: DC | PRN

## 2016-03-02 MED ORDER — OXYCODONE HCL 5 MG PO TABS: 5.0000 mg | ORAL_TABLET | ORAL | Status: DC | PRN

## 2016-03-02 NOTE — Patient Instructions (Signed)

## 2016-03-02 NOTE — Progress Notes (Signed)
Pre visit review using our clinic review tool, if applicable. No additional management support is needed unless otherwise documented below in the visit note. 

## 2016-03-03 NOTE — Progress Notes (Signed)
Subjective:  Patient ID: Toni Ramirez, female    DOB: 1955/12/14  Age: 60 y.o. MRN: [id number]  CC: Rash   HPI Toni Ramirez presents for a 2 day hx of painful rash over left middle back area, she tells me that the pain radiates around to her left side and she has had a few episodes of nausea but no vomiting.  Outpatient Prescriptions Prior to Visit  Medication Sig Dispense Refill  . ALPRAZolam (XANAX) 0.5 MG tablet Take 1 tablet (0.5 mg total) by mouth at bedtime as needed for anxiety. 30 tablet 2  . levothyroxine (SYNTHROID) 100 MCG tablet Take 1 tablet (100 mcg total) by mouth daily before breakfast. 30 tablet 1   No facility-administered medications prior to visit.    ROS Review of Systems  Constitutional: Negative.  Negative for fever, chills and fatigue.  HENT: Negative.  Negative for sore throat.   Eyes: Negative.   Respiratory: Negative.   Cardiovascular: Negative.   Gastrointestinal: Positive for nausea. Negative for vomiting, abdominal pain and diarrhea.  Endocrine: Negative.   Genitourinary: Negative.   Musculoskeletal: Negative.   Skin: Positive for rash. Negative for color change, pallor and wound.  Allergic/Immunologic: Negative.   Neurological: Negative.  Negative for dizziness, weakness, light-headedness and headaches.  Hematological: Negative.  Negative for adenopathy. Does not bruise/bleed easily.    Objective:  BP 100/84 mmHg  Pulse 65  Temp(Src) 98.2 F (36.8 C) (Oral)  Resp 16  Ht 5\' 5"  (1.651 m)  Wt 203 lb (92.08 kg)  BMI 33.78 kg/m2  SpO2 98%  LMP 06/14/2010  BP Readings from Last 3 Encounters:  03/02/16 100/84  06/08/15 110/80  03/06/15 128/86    Wt Readings from Last 3 Encounters:  03/02/16 203 lb (92.08 kg)  06/08/15 196 lb 8 oz (89.132 kg)  03/06/15 199 lb (90.266 kg)    Physical Exam  Constitutional: She is oriented to person, place, and time. She appears well-developed and well-nourished. No distress.  HENT:  Head:  Normocephalic and atraumatic.  Mouth/Throat: Oropharynx is clear and moist. No oropharyngeal exudate.  Eyes: Conjunctivae are normal. Right eye exhibits no discharge. Left eye exhibits no discharge. No scleral icterus.  Neck: Normal range of motion. Neck supple. No JVD present. No tracheal deviation present. No thyromegaly present.  Cardiovascular: Regular rhythm, normal heart sounds and intact distal pulses.  Exam reveals no gallop and no friction rub.   No murmur heard. Pulmonary/Chest: Effort normal and breath sounds normal. No stridor. No respiratory distress. She has no wheezes. She has no rales. She exhibits no tenderness.  Abdominal: Soft. Bowel sounds are normal. She exhibits no distension and no mass. There is no tenderness. There is no rebound and no guarding.  Musculoskeletal: Normal range of motion. She exhibits no edema or tenderness.       Back:  Lymphadenopathy:    She has no cervical adenopathy.  Neurological: She is oriented to person, place, and time.  Skin: Skin is warm and dry. Rash noted. She is not diaphoretic. No erythema. No pallor.  Vitals reviewed.   Lab Results  Component Value Date   WBC 6.5 12/16/2013   HGB 14.1 12/16/2013   HCT 41.4 12/16/2013   PLT 302 12/16/2013   GLUCOSE 122* 12/24/2013   ALT 22 12/24/2013   AST 18 12/24/2013   NA 140 12/24/2013   K 4.7 12/24/2013   CL 101 12/24/2013   CREATININE 0.86 12/24/2013   BUN 14 12/24/2013   CO2 28  12/24/2013   TSH 0.82 11/13/2013   HGBA1C 5.6 11/20/2013    Nm Whole Body I131 Scan S/p Ca Rx  03/05/2014  CLINICAL DATA:  Thyroid cancer, 7 days as therapy . Papillary thyroid carcinoma. 6 cm lesion with no evidence of extra capsular extension. T3 lesion. EXAM: NUCLEAR MEDICINE I-131 POST THERAPY WHOLE BODY SCAN TECHNIQUE: The patient received 102 mCi I-131 sodium iodide for the treatment of thyroid cancer within the past 10 days. The patient returns today, and whole body scanning was performed in the  anterior and posterior projections. COMPARISON:  Neck ultrasound 114 1,015 FINDINGS: There is uptake within the left and right aspect of the thyroid bed most consistent residual thyroid tissue. There is a single focus midline superior to the dominant uptake within thyroid bed which also likely represents residual thyroid tissue. Physiologic uptake noted in the salivary glands GI and GU tract. No evidence distant metastasis. IMPRESSION: 1. No evidence of metastasis all whole-body I 131 scan. 2. Three foci of uptake in thyroid bed likely represent remnant thyroid tissue Electronically Signed   By: Suzy Bouchard M.D.   On: 03/05/2014 10:32    Assessment & Plan:   Toni Ramirez was seen today for rash.  Diagnoses and all orders for this visit:  Shingles rash- I will treat the infection with valtrex, will control the pain with oxycodone, she will use phenergan as needed for nausea -     Discontinue: valACYclovir (VALTREX) 1000 MG tablet; Take 1 tablet (1,000 mg total) by mouth 3 (three) times daily. -     promethazine (PHENERGAN) 12.5 MG tablet; Take 1 tablet (12.5 mg total) by mouth every 6 (six) hours as needed for nausea or vomiting. -     oxyCODONE (OXY IR/ROXICODONE) 5 MG immediate release tablet; Take 1 tablet (5 mg total) by mouth every 4 (four) hours as needed for severe pain. -     valACYclovir (VALTREX) 1000 MG tablet; Take 1 tablet (1,000 mg total) by mouth 3 (three) times daily.   I am having Toni Ramirez start on promethazine and oxyCODONE. I am also having her maintain her ALPRAZolam, levothyroxine, Lorcaserin HCl ER, and valACYclovir.  Meds ordered this encounter  Medications  . levothyroxine (SYNTHROID, LEVOTHROID) 125 MCG tablet    Sig:     Refill:  5  . Lorcaserin HCl ER (BELVIQ XR) 20 MG TB24    Sig: Take 20 mg by mouth daily.  Marland Kitchen DISCONTD: valACYclovir (VALTREX) 1000 MG tablet    Sig: Take 1 tablet (1,000 mg total) by mouth 3 (three) times daily.    Dispense:  21 tablet    Refill:   1  . promethazine (PHENERGAN) 12.5 MG tablet    Sig: Take 1 tablet (12.5 mg total) by mouth every 6 (six) hours as needed for nausea or vomiting.    Dispense:  30 tablet    Refill:  0  . oxyCODONE (OXY IR/ROXICODONE) 5 MG immediate release tablet    Sig: Take 1 tablet (5 mg total) by mouth every 4 (four) hours as needed for severe pain.    Dispense:  45 tablet    Refill:  0  . valACYclovir (VALTREX) 1000 MG tablet    Sig: Take 1 tablet (1,000 mg total) by mouth 3 (three) times daily.    Dispense:  21 tablet    Refill:  1     Follow-up: Return if symptoms worsen or fail to improve.  Scarlette Calico, MD

## 2016-03-14 ENCOUNTER — Inpatient Hospital Stay (HOSPITAL_COMMUNITY)
Admission: RE | Admit: 2016-03-14 | Discharge: 2016-03-14 | Disposition: A | Discharge status: Home / Self Care | Payer: BLUE CROSS/BLUE SHIELD | Source: Ambulatory Visit | Attending: Endocrinology | Admitting: Endocrinology

## 2016-03-15 ENCOUNTER — Ambulatory Visit (HOSPITAL_COMMUNITY): Payer: BLUE CROSS/BLUE SHIELD

## 2016-03-16 ENCOUNTER — Ambulatory Visit (HOSPITAL_COMMUNITY): Payer: BLUE CROSS/BLUE SHIELD

## 2016-03-18 ENCOUNTER — Encounter (HOSPITAL_COMMUNITY): Payer: BLUE CROSS/BLUE SHIELD

## 2016-03-24 ENCOUNTER — Encounter: Payer: Self-pay | Admitting: Internal Medicine

## 2016-06-27 LAB — LIPID PANEL
CHOLESTEROL: 251 mg/dL — AB (ref 0–200)
HDL: 50 mg/dL (ref 35–70)
LDL Cholesterol: 163 mg/dL
TRIGLYCERIDES: 192 mg/dL — AB (ref 40–160)

## 2016-08-01 ENCOUNTER — Encounter (HOSPITAL_COMMUNITY)
Admission: RE | Admit: 2016-08-01 | Discharge: 2016-08-01 | Disposition: A | Discharge status: Home / Self Care | Payer: BLUE CROSS/BLUE SHIELD | Source: Ambulatory Visit | Attending: Endocrinology | Admitting: Endocrinology

## 2016-08-01 DIAGNOSIS — C73 Malignant neoplasm of thyroid gland: Secondary | ICD-10-CM | POA: Diagnosis present

## 2016-08-01 MED ORDER — THYROTROPIN ALFA 1.1 MG IM SOLR
INTRAMUSCULAR | Status: AC
  Filled 2016-08-01: qty 0.9

## 2016-08-01 MED ORDER — THYROTROPIN ALFA 1.1 MG IM SOLR
0.9000 mg | INTRAMUSCULAR | Status: AC
  Administered 2016-08-01: 0.9 mg via INTRAMUSCULAR

## 2016-08-02 ENCOUNTER — Encounter (HOSPITAL_COMMUNITY)
Admission: RE | Admit: 2016-08-02 | Discharge: 2016-08-02 | Disposition: A | Discharge status: Home / Self Care | Payer: BLUE CROSS/BLUE SHIELD | Source: Ambulatory Visit | Attending: Endocrinology | Admitting: Endocrinology

## 2016-08-02 DIAGNOSIS — C73 Malignant neoplasm of thyroid gland: Secondary | ICD-10-CM | POA: Insufficient documentation

## 2016-08-02 MED ORDER — THYROTROPIN ALFA 1.1 MG IM SOLR
INTRAMUSCULAR | Status: AC
  Filled 2016-08-02: qty 0.9

## 2016-08-02 MED ORDER — THYROTROPIN ALFA 1.1 MG IM SOLR
0.9000 mg | INTRAMUSCULAR | Status: AC
  Administered 2016-08-02: 0.9 mg via INTRAMUSCULAR

## 2016-08-03 ENCOUNTER — Encounter (HOSPITAL_COMMUNITY)
Admission: RE | Admit: 2016-08-03 | Discharge: 2016-08-03 | Disposition: A | Discharge status: Home / Self Care | Payer: BLUE CROSS/BLUE SHIELD | Source: Ambulatory Visit | Attending: Endocrinology | Admitting: Endocrinology

## 2016-08-05 ENCOUNTER — Encounter (HOSPITAL_COMMUNITY)
Admission: RE | Admit: 2016-08-05 | Discharge: 2016-08-05 | Disposition: A | Discharge status: Home / Self Care | Payer: BLUE CROSS/BLUE SHIELD | Source: Ambulatory Visit | Attending: Endocrinology | Admitting: Endocrinology

## 2016-08-05 DIAGNOSIS — C73 Malignant neoplasm of thyroid gland: Secondary | ICD-10-CM | POA: Diagnosis not present

## 2016-08-05 MED ORDER — SODIUM IODIDE I 131 CAPSULE
3.8000 | Freq: Once | INTRAVENOUS | Status: AC | PRN
  Administered 2016-08-05: 3.8 via ORAL

## 2016-08-15 ENCOUNTER — Other Ambulatory Visit: Payer: Self-pay | Admitting: Endocrinology

## 2016-08-15 ENCOUNTER — Other Ambulatory Visit (HOSPITAL_COMMUNITY): Payer: Self-pay | Admitting: Endocrinology

## 2016-08-15 DIAGNOSIS — C73 Malignant neoplasm of thyroid gland: Secondary | ICD-10-CM

## 2016-08-18 ENCOUNTER — Other Ambulatory Visit (HOSPITAL_COMMUNITY): Payer: Self-pay | Admitting: Endocrinology

## 2016-08-18 DIAGNOSIS — C73 Malignant neoplasm of thyroid gland: Secondary | ICD-10-CM

## 2016-08-23 ENCOUNTER — Ambulatory Visit (HOSPITAL_COMMUNITY): Payer: BLUE CROSS/BLUE SHIELD

## 2016-08-24 ENCOUNTER — Ambulatory Visit (HOSPITAL_COMMUNITY): Payer: BLUE CROSS/BLUE SHIELD

## 2016-08-27 ENCOUNTER — Ambulatory Visit (HOSPITAL_COMMUNITY): Admission: RE | Admit: 2016-08-27 | Payer: BLUE CROSS/BLUE SHIELD | Source: Ambulatory Visit

## 2016-09-01 ENCOUNTER — Other Ambulatory Visit: Payer: Self-pay | Admitting: Endocrinology

## 2016-09-01 DIAGNOSIS — C73 Malignant neoplasm of thyroid gland: Secondary | ICD-10-CM

## 2016-09-05 ENCOUNTER — Ambulatory Visit
Admission: RE | Admit: 2016-09-05 | Discharge: 2016-09-05 | Disposition: A | Discharge status: Home / Self Care | Payer: BLUE CROSS/BLUE SHIELD | Source: Ambulatory Visit | Attending: Endocrinology | Admitting: Endocrinology

## 2016-09-05 DIAGNOSIS — C73 Malignant neoplasm of thyroid gland: Secondary | ICD-10-CM

## 2017-02-27 ENCOUNTER — Encounter: Payer: Self-pay | Admitting: Internal Medicine

## 2017-02-27 ENCOUNTER — Ambulatory Visit (INDEPENDENT_AMBULATORY_CARE_PROVIDER_SITE_OTHER): Payer: BC Managed Care – PPO | Admitting: Internal Medicine

## 2017-02-27 VITALS — BP 118/70 | HR 65 | Temp 98.2°F | Resp 16 | Ht 65.0 in | Wt 200.0 lb

## 2017-02-27 DIAGNOSIS — E039 Hypothyroidism, unspecified: Secondary | ICD-10-CM | POA: Diagnosis not present

## 2017-02-27 DIAGNOSIS — R739 Hyperglycemia, unspecified: Secondary | ICD-10-CM

## 2017-02-27 DIAGNOSIS — F40243 Fear of flying: Secondary | ICD-10-CM | POA: Diagnosis not present

## 2017-02-27 DIAGNOSIS — R0789 Other chest pain: Secondary | ICD-10-CM | POA: Diagnosis not present

## 2017-02-27 DIAGNOSIS — Z0000 Encounter for general adult medical examination without abnormal findings: Secondary | ICD-10-CM

## 2017-02-27 HISTORY — DX: Hypothyroidism, unspecified: E03.9

## 2017-02-27 MED ORDER — ALPRAZOLAM 0.5 MG PO TABS: 0.5000 mg | ORAL_TABLET | Freq: Every evening | ORAL | 2 refills | Status: DC | PRN

## 2017-02-27 NOTE — Progress Notes (Signed)
Pre visit review using our clinic review tool, if applicable. No additional management support is needed unless otherwise documented below in the visit note. 

## 2017-02-27 NOTE — Patient Instructions (Signed)
Nonspecific Chest Pain °Chest pain can be caused by many different conditions. There is always a chance that your pain could be related to something serious, such as a heart attack or a blood clot in your lungs. Chest pain can also be caused by conditions that are not life-threatening. If you have chest pain, it is very important to follow up with your health care provider. °What are the causes? °Causes of this condition include: °· Heartburn. °· Pneumonia or bronchitis. °· Anxiety or stress. °· Inflammation around your heart (pericarditis) or lung (pleuritis or pleurisy). °· A blood clot in your lung. °· A collapsed lung (pneumothorax). This can develop suddenly on its own (spontaneous pneumothorax) or from trauma to the chest. °· Shingles infection (varicella-zoster virus). °· Heart attack. °· Damage to the bones, muscles, and cartilage that make up your chest wall. This can include: °? Bruised bones due to injury. °? Strained muscles or cartilage due to frequent or repeated coughing or overwork. °? Fracture to one or more ribs. °? Sore cartilage due to inflammation (costochondritis). ° °What increases the risk? °Risk factors for this condition may include: °· Activities that increase your risk for trauma or injury to your chest. °· Respiratory infections or conditions that cause frequent coughing. °· Medical conditions or overeating that can cause heartburn. °· Heart disease or family history of heart disease. °· Conditions or health behaviors that increase your risk of developing a blood clot. °· Having had chicken pox (varicella zoster). ° °What are the signs or symptoms? °Chest pain can feel like: °· Burning or tingling on the surface of your chest or deep in your chest. °· Crushing, pressure, aching, or squeezing pain. °· Dull or sharp pain that is worse when you move, cough, or take a deep breath. °· Pain that is also felt in your back, neck, shoulder, or arm, or pain that spreads to any of these  areas. ° °Your chest pain may come and go, or it may stay constant. °How is this diagnosed? °Lab tests or other studies may be needed to find the cause of your pain. Your health care provider may have you take a test called an ECG (electrocardiogram). An ECG records your heartbeat patterns at the time the test is performed. You may also have other tests, such as: °· Transthoracic echocardiogram (TTE). In this test, sound waves are used to create a picture of the heart structures and to look at how blood flows through your heart. °· Transesophageal echocardiogram (TEE). This is a more advanced imaging test that takes images from inside your body. It allows your health care provider to see your heart in finer detail. °· Cardiac monitoring. This allows your health care provider to monitor your heart rate and rhythm in real time. °· Holter monitor. This is a portable device that records your heartbeat and can help to diagnose abnormal heartbeats. It allows your health care provider to track your heart activity for several days, if needed. °· Stress tests. These can be done through exercise or by taking medicine that makes your heart beat more quickly. °· Blood tests. °· Other imaging tests. ° °How is this treated? °Treatment depends on what is causing your chest pain. Treatment may include: °· Medicines. These may include: °? Acid blockers for heartburn. °? Anti-inflammatory medicine. °? Pain medicine for inflammatory conditions. °? Antibiotic medicine, if an infection is present. °? Medicines to dissolve blood clots. °? Medicines to treat coronary artery disease (CAD). °· Supportive care for conditions that   do not require medicines. This may include: °? Resting. °? Applying heat or cold packs to injured areas. °? Limiting activities until pain decreases. ° °Follow these instructions at home: °Medicines °· If you were prescribed an antibiotic, take it as told by your health care provider. Do not stop taking the  antibiotic even if you start to feel better. °· Take over-the-counter and prescription medicines only as told by your health care provider. °Lifestyle °· Do not use any products that contain nicotine or tobacco, such as cigarettes and e-cigarettes. If you need help quitting, ask your health care provider. °· Do not drink alcohol. °· Make lifestyle changes as directed by your health care provider. These may include: °? Getting regular exercise. Ask your health care provider to suggest some activities that are safe for you. °? Eating a heart-healthy diet. A registered dietitian can help you to learn healthy eating options. °? Maintaining a healthy weight. °? Managing diabetes, if necessary. °? Reducing stress, such as with yoga or relaxation techniques. °General instructions °· Avoid any activities that bring on chest pain. °· If heartburn is the cause for your chest pain, raise (elevate) the head of your bed about 6 inches (15 cm) by putting blocks under the legs. Sleeping with more pillows does not effectively relieve heartburn because it only changes the position of your head. °· Keep all follow-up visits as told by your health care provider. This is important. This includes any further testing if your chest pain does not go away. °Contact a health care provider if: °· Your chest pain does not go away. °· You have a rash with blisters on your chest. °· You have a fever. °· You have chills. °Get help right away if: °· Your chest pain is worse. °· You have a cough that gets worse, or you cough up blood. °· You have severe pain in your abdomen. °· You have severe weakness. °· You faint. °· You have sudden, unexplained chest discomfort. °· You have sudden, unexplained discomfort in your arms, back, neck, or jaw. °· You have shortness of breath at any time. °· You suddenly start to sweat, or your skin gets clammy. °· You feel nauseous or you vomit. °· You suddenly feel light-headed or dizzy. °· Your heart begins to beat  quickly, or it feels like it is skipping beats. °These symptoms may represent a serious problem that is an emergency. Do not wait to see if the symptoms will go away. Get medical help right away. Call your local emergency services (911 in the [place] Do not drive yourself to the hospital. °This information is not intended to replace advice given to you by your health care provider. Make sure you discuss any questions you have with your health care provider. °Document Released: 08/10/2005 Document Revised: 07/25/2016 Document Reviewed: 07/25/2016 °[employer] Interactive Patient Education © 2017 [employer] ° °

## 2017-02-27 NOTE — Progress Notes (Signed)
Subjective:  Patient ID: Toni Ramirez, female    DOB: 1956-10-19  Age: 61 y.o. MRN: [id number]  CC: Hyperlipidemia and Chest Pain   HPI Toni Ramirez presents for f/up - She is followed closely by endocrinology regarding her history of thyroid cancer and tells me she has some scans there is scheduled soon concerning a lesion in her lungs. She also wants to know if her cholesterol is elevated and whether or not she needs to be on a cholesterol medicine. She has anxiety and panic related to flying and wants a refill on Xanax. She also complains of about a 20 year history of intermittent episodes of diffuse chest discomfort. She tells me this was previously evaluated by another PCP. The symptom occurs at rest and she tells me that there is a diffuse discomfort and feeling like she can't take a deep, comfortable breath. The symptoms occurred during emotionally stressful situations.  Outpatient Medications Prior to Visit  Medication Sig Dispense Refill  . levothyroxine (SYNTHROID, LEVOTHROID) 125 MCG tablet   5  . ALPRAZolam (XANAX) 0.5 MG tablet Take 1 tablet (0.5 mg total) by mouth at bedtime as needed for anxiety. (Patient not taking: Reported on 02/27/2017) 30 tablet 2  . Lorcaserin HCl ER (BELVIQ XR) 20 MG TB24 Take 20 mg by mouth daily.    Marland Kitchen oxyCODONE (OXY IR/ROXICODONE) 5 MG immediate release tablet Take 1 tablet (5 mg total) by mouth every 4 (four) hours as needed for severe pain. 45 tablet 0  . promethazine (PHENERGAN) 12.5 MG tablet Take 1 tablet (12.5 mg total) by mouth every 6 (six) hours as needed for nausea or vomiting. 30 tablet 0   No facility-administered medications prior to visit.     ROS Review of Systems  Constitutional: Negative for appetite change, diaphoresis, fatigue and unexpected weight change.  HENT: Negative.   Eyes: Negative.   Respiratory: Negative.  Negative for cough, chest tightness, shortness of breath, wheezing and stridor.   Cardiovascular: Positive for  chest pain. Negative for palpitations and leg swelling.  Gastrointestinal: Negative for abdominal pain, constipation, diarrhea, nausea and vomiting.  Endocrine: Negative.  Negative for cold intolerance and heat intolerance.  Genitourinary: Negative.  Negative for difficulty urinating.  Musculoskeletal: Negative.  Negative for back pain, myalgias and neck pain.  Skin: Negative.   Allergic/Immunologic: Negative.   Neurological: Negative.  Negative for dizziness, weakness and light-headedness.  Hematological: Negative for adenopathy. Does not bruise/bleed easily.  Psychiatric/Behavioral: Negative for confusion, decreased concentration, dysphoric mood and sleep disturbance. The patient is nervous/anxious.     Objective:  BP 118/70 (BP Location: Left Arm, Patient Position: Sitting, Cuff Size: Large)   Pulse 65   Temp 98.2 F (36.8 C) (Oral)   Resp 16   Ht 5\' 5"  (1.651 m)   Wt 200 lb (90.7 kg)   LMP 06/14/2010   SpO2 100%   BMI 33.28 kg/m   BP Readings from Last 3 Encounters:  02/27/17 118/70  03/02/16 100/84  06/08/15 110/80    Wt Readings from Last 3 Encounters:  02/27/17 200 lb (90.7 kg)  03/02/16 203 lb (92.1 kg)  06/08/15 196 lb 8 oz (89.1 kg)    Physical Exam  Constitutional: She is oriented to person, place, and time. No distress.  HENT:  Mouth/Throat: Oropharynx is clear and moist. No oropharyngeal exudate.  Eyes: Conjunctivae are normal. Right eye exhibits no discharge. Left eye exhibits no discharge. No scleral icterus.  Neck: Normal range of motion. Neck supple. No  JVD present. No tracheal deviation present. No thyromegaly present.  Cardiovascular: Normal rate, regular rhythm, normal heart sounds and intact distal pulses.  Exam reveals no gallop and no friction rub.   No murmur heard. EKG --  Sinus  Bradycardia  Low voltage in precordial leads.   ABNORMAL - NO CHANGE COMPARED TO EKG FROM 2015   Pulmonary/Chest: Effort normal and breath sounds normal. No  stridor. No respiratory distress. She has no wheezes. She has no rales. She exhibits no tenderness.  Abdominal: Soft. Bowel sounds are normal. She exhibits no distension and no mass. There is no tenderness. There is no rebound and no guarding.  Musculoskeletal: Normal range of motion. She exhibits no edema, tenderness or deformity.  Lymphadenopathy:    She has no cervical adenopathy.  Neurological: She is oriented to person, place, and time.  Skin: Skin is warm and dry. No rash noted. She is not diaphoretic. No erythema. No pallor.  Psychiatric: She has a normal mood and affect. Her behavior is normal. Judgment and thought content normal.  Vitals reviewed.   Lab Results  Component Value Date   WBC 5.8 03/02/2016   HGB 13.5 03/02/2016   HCT 41 03/02/2016   PLT 286 03/02/2016   GLUCOSE 122 (H) 12/24/2013   CHOL 251 (A) 06/27/2016   TRIG 192 (A) 06/27/2016   HDL 50 06/27/2016   LDLCALC 163 06/27/2016   ALT 9 03/02/2016   AST 14 03/02/2016   NA 141 03/02/2016   K 4.4 03/02/2016   CL 101 12/24/2013   CREATININE 0.9 03/02/2016   BUN 18 03/02/2016   CO2 28 12/24/2013   TSH 0.82 11/13/2013   HGBA1C 5.6 11/20/2013    Ct Chest Wo Contrast  Result Date: 09/05/2016 CLINICAL DATA:  Thyroid cancer with thyroidectomy 12/25/2013. No chest complaints. Nonsmoker. Evaluate for metastasis. EXAM: CT CHEST WITHOUT CONTRAST TECHNIQUE: Multidetector CT imaging of the chest was performed following the standard protocol without IV contrast. COMPARISON:  11/20/2013 chest radiograph, 08/05/2016 I-131 nuclear medicine study FINDINGS: Cardiovascular: No significant vascular findings. Normal heart size. No pericardial effusion. Mediastinum/Nodes: No enlarged mediastinal or axillary lymph nodes. Trachea and is esophagus demonstrate no significant findings. Surgical clips in the thyroid bed from prior thyroidectomy. Small hiatal hernia. Lungs/Pleura: 2 mm calcified granuloma in the superior segment of the right  lower lobe series 4, image 44. Minimal pleural thickening along the left major fissure series 4, image 24 measuring 2 mm. Left lower lobe pleural-based 3 mm density series 4, image 83. No confluent pulmonary consolidation, effusion or pneumothorax. No dominant pulmonary mass. Upper Abdomen: Uncomplicated cholelithiasis. Normal appearing adrenal glands. Musculoskeletal: Lower cervical degenerative disc disease. No lytic or blastic abnormality identified. IMPRESSION: Thyroidectomy without conclusive findings for metastatic disease. No left basilar mass corresponding with the area of increased uptake suggested on recent iodine 131 study. There are two tiny 3 mm or less pleural-based densities on the left along the major fissure and left lower lobe pleural with a calcified appearing right lower lobe granuloma. Electronically Signed   By: Ashley Royalty M.D.   On: 09/05/2016 15:11    Assessment & Plan:   Luceal was seen today for hyperlipidemia and chest pain.  Diagnoses and all orders for this visit:  Hyperglycemia  Acquired hypothyroidism- this is managed by ENDO  Fear of flying -     ALPRAZolam (XANAX) 0.5 MG tablet; Take 1 tablet (0.5 mg total) by mouth at bedtime as needed for anxiety.  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 She will let me know if she develops any new or worsening symptoms. -     EKG 12-Lead   I have discontinued Ms. Stay Lorcaserin HCl ER, promethazine, and oxyCODONE. I am also having her maintain her levothyroxine and ALPRAZolam.  Meds ordered this encounter  Medications  . ALPRAZolam (XANAX) 0.5 MG tablet    Sig: Take 1 tablet (0.5 mg total) by  mouth at bedtime as needed for anxiety.    Dispense:  30 tablet    Refill:  2     Follow-up: Return in about 3 months (around 05/29/2017).  Scarlette Calico, MD

## 2017-03-21 ENCOUNTER — Encounter: Payer: Self-pay | Admitting: Internal Medicine

## 2017-03-21 ENCOUNTER — Ambulatory Visit (INDEPENDENT_AMBULATORY_CARE_PROVIDER_SITE_OTHER): Payer: BC Managed Care – PPO | Admitting: Internal Medicine

## 2017-03-21 DIAGNOSIS — J301 Allergic rhinitis due to pollen: Secondary | ICD-10-CM

## 2017-03-21 MED ORDER — HYDROCODONE-HOMATROPINE 5-1.5 MG/5ML PO SYRP: 5.0000 mL | ORAL_SOLUTION | Freq: Three times a day (TID) | ORAL | 0 refills | Status: DC | PRN

## 2017-03-21 NOTE — Progress Notes (Signed)
   Subjective:    Patient ID: Toni Ramirez, female    DOB: [birth year], 61 y.o.   MRN: [id number]  HPI The patient is a 61 YO female coming in for sinus problems. Going on for about 2-3 days. She is having pressure and headaches, congestion and nasal drainage. Overall worsening some. She is taking some mucinex and airbourne which she thinks is helping some. She is coughing more stuff out today and thinks that this is helpful. She is not taking any allergy medicine right now. Denies fevers or chills. Denies headaches or sinus pressure.   Review of Systems  Constitutional: Positive for activity change. Negative for appetite change, chills, fatigue, fever and unexpected weight change.  HENT: Positive for congestion, postnasal drip, rhinorrhea and sinus pressure. Negative for ear discharge, ear pain, sinus pain, sore throat and trouble swallowing.   Eyes: Negative.   Respiratory: Positive for cough. Negative for chest tightness, shortness of breath and wheezing.   Cardiovascular: Negative.   Gastrointestinal: Negative.   Musculoskeletal: Negative.   Neurological: Negative.       Objective:   Physical Exam  Constitutional: She is oriented to person, place, and time. She appears well-developed and well-nourished.  HENT:  Head: Normocephalic and atraumatic.  TMs normal, oropharynx with redness and clear drainage, nose without crusting, no sinus pressure.   Eyes: EOM are normal.  Neck: Normal range of motion. No JVD present.  Cardiovascular: Normal rate and regular rhythm.   Pulmonary/Chest: Effort normal and breath sounds normal. No respiratory distress. She has no wheezes. She has no rales.  Abdominal: Soft.  Lymphadenopathy:    She has no cervical adenopathy.  Neurological: She is alert and oriented to person, place, and time.  Skin: Skin is warm and dry.   Vitals:   03/21/17 1420  BP: 130/82  Pulse: 66  Resp: 12  Temp: 97.9 F (36.6 C)  TempSrc: Oral  SpO2: 99%  Weight: 197 lb  (89.4 kg)  Height: 5\' 5"  (1.651 m)      Assessment & Plan:

## 2017-03-21 NOTE — Patient Instructions (Signed)
We have given you the cough medicine that you can use in the evening.    Viral Respiratory Infection A respiratory infection is an illness that affects part of the respiratory system, such as the lungs, nose, or throat. Most respiratory infections are caused by either viruses or bacteria. A respiratory infection that is caused by a virus is called a viral respiratory infection. Common types of viral respiratory infections include:  A cold.  The flu (influenza).  A respiratory syncytial virus (RSV) infection. How do I know if I have a viral respiratory infection? Most viral respiratory infections cause:  A stuffy or runny nose.  Yellow or green nasal discharge.  A cough.  Sneezing.  Fatigue.  Achy muscles.  A sore throat.  Sweating or chills.  A fever.  A headache. How are viral respiratory infections treated? If influenza is diagnosed early, it may be treated with an antiviral medicine that shortens the length of time a person has symptoms. Symptoms of viral respiratory infections may be treated with over-the-counter and prescription medicines, such as:  Expectorants. These make it easier to cough up mucus.  Decongestant nasal sprays. Health care providers do not prescribe antibiotic medicines for viral infections. This is because antibiotics are designed to kill bacteria. They have no effect on viruses. How do I know if I should stay home from work or school? To avoid exposing others to your respiratory infection, stay home if you have:  A fever.  A persistent cough.  A sore throat.  A runny nose.  Sneezing.  Muscles aches.  Headaches.  Fatigue.  Weakness.  Chills.  Sweating.  Nausea. Follow these instructions at home:  Rest as much as possible.  Take over-the-counter and prescription medicines only as told by your health care provider.  Drink enough fluid to keep your urine clear or pale yellow. This helps prevent dehydration and helps loosen  up mucus.  Gargle with a salt-water mixture 3-4 times per day or as needed. To make a salt-water mixture, completely dissolve -1 tsp of salt in 1 cup of warm water.  Use nose drops made from salt water to ease congestion and soften raw skin around your nose.  Do not drink alcohol.  Do not use tobacco products, including cigarettes, chewing tobacco, and e-cigarettes. If you need help quitting, ask your health care provider. Contact a health care provider if:  Your symptoms last for 10 days or longer.  Your symptoms get worse over time.  You have a fever.  You have severe sinus pain in your face or forehead.  The glands in your jaw or neck become very swollen. Get help right away if:  You feel pain or pressure in your chest.  You have shortness of breath.  You faint or feel like you will faint.  You have severe and persistent vomiting.  You feel confused or disoriented. This information is not intended to replace advice given to you by your health care provider. Make sure you discuss any questions you have with your health care provider. Document Released: 08/10/2005 Document Revised: 04/07/2016 Document Reviewed: 04/08/2015 Elsevier Interactive Patient Education  2017 [employer].

## 2017-03-21 NOTE — Progress Notes (Signed)
Pre visit review using our clinic review tool, if applicable. No additional management support is needed unless otherwise documented below in the visit note. 

## 2017-03-22 DIAGNOSIS — J309 Allergic rhinitis, unspecified: Secondary | ICD-10-CM | POA: Insufficient documentation

## 2017-03-22 HISTORY — DX: Allergic rhinitis, unspecified: J30.9

## 2017-03-22 NOTE — Assessment & Plan Note (Signed)
Rx for hycodan for cough at night time and encouraged her to start zyrtec daily to help with her allergy symptoms. No indication for steroids or antibiotics at this time.

## 2017-05-31 ENCOUNTER — Other Ambulatory Visit (HOSPITAL_COMMUNITY): Payer: Self-pay | Admitting: Endocrinology

## 2017-05-31 DIAGNOSIS — C73 Malignant neoplasm of thyroid gland: Secondary | ICD-10-CM

## 2017-06-19 ENCOUNTER — Encounter (HOSPITAL_COMMUNITY)
Admission: RE | Admit: 2017-06-19 | Discharge: 2017-06-19 | Disposition: A | Discharge status: Home / Self Care | Payer: BC Managed Care – PPO | Source: Ambulatory Visit | Attending: Endocrinology | Admitting: Endocrinology

## 2017-06-19 DIAGNOSIS — C73 Malignant neoplasm of thyroid gland: Secondary | ICD-10-CM | POA: Insufficient documentation

## 2017-06-19 MED ORDER — THYROTROPIN ALFA 1.1 MG IM SOLR
INTRAMUSCULAR | Status: AC
  Filled 2017-06-19: qty 0.9

## 2017-06-19 MED ORDER — THYROTROPIN ALFA 1.1 MG IM SOLR
0.9000 mg | INTRAMUSCULAR | Status: AC
Start: 2017-06-19 — End: 2017-06-19
  Administered 2017-06-19: 0.9 mg via INTRAMUSCULAR

## 2017-06-20 ENCOUNTER — Encounter (HOSPITAL_COMMUNITY)
Admission: RE | Admit: 2017-06-20 | Discharge: 2017-06-20 | Disposition: A | Discharge status: Home / Self Care | Payer: BC Managed Care – PPO | Source: Ambulatory Visit | Attending: Endocrinology | Admitting: Endocrinology

## 2017-06-20 DIAGNOSIS — C73 Malignant neoplasm of thyroid gland: Secondary | ICD-10-CM | POA: Diagnosis not present

## 2017-06-20 MED ORDER — THYROTROPIN ALFA 1.1 MG IM SOLR
0.9000 mg | INTRAMUSCULAR | Status: AC
  Administered 2017-06-20: 0.9 mg via INTRAMUSCULAR

## 2017-06-21 ENCOUNTER — Encounter (HOSPITAL_COMMUNITY)
Admission: RE | Admit: 2017-06-21 | Discharge: 2017-06-21 | Disposition: A | Discharge status: Home / Self Care | Payer: BC Managed Care – PPO | Source: Ambulatory Visit | Attending: Endocrinology | Admitting: Endocrinology

## 2017-06-21 MED ORDER — SODIUM IODIDE I 131 CAPSULE
4.3000 | Freq: Once | INTRAVENOUS | Status: AC | PRN
  Administered 2017-06-21: 4.3 via ORAL

## 2017-06-23 ENCOUNTER — Encounter (HOSPITAL_COMMUNITY)
Admission: RE | Admit: 2017-06-23 | Discharge: 2017-06-23 | Disposition: A | Discharge status: Home / Self Care | Payer: BC Managed Care – PPO | Source: Ambulatory Visit | Attending: Endocrinology | Admitting: Endocrinology

## 2017-06-23 DIAGNOSIS — C73 Malignant neoplasm of thyroid gland: Secondary | ICD-10-CM

## 2017-06-23 MED ORDER — SODIUM IODIDE I 131 CAPSULE
4.3000 | Freq: Once | INTRAVENOUS | Status: AC | PRN
  Administered 2017-06-23: 4.3 via ORAL

## 2017-07-19 LAB — HM PAP SMEAR

## 2017-07-19 LAB — HM MAMMOGRAPHY

## 2019-08-19 ENCOUNTER — Other Ambulatory Visit: Payer: Self-pay

## 2019-08-19 ENCOUNTER — Encounter: Payer: Self-pay | Admitting: Internal Medicine

## 2019-08-19 ENCOUNTER — Ambulatory Visit (INDEPENDENT_AMBULATORY_CARE_PROVIDER_SITE_OTHER): Payer: BC Managed Care – PPO | Admitting: Internal Medicine

## 2019-08-19 ENCOUNTER — Other Ambulatory Visit (INDEPENDENT_AMBULATORY_CARE_PROVIDER_SITE_OTHER): Payer: BC Managed Care – PPO

## 2019-08-19 VITALS — BP 124/64 | HR 57 | Temp 98.1°F | Resp 16 | Ht 65.0 in | Wt 191.0 lb

## 2019-08-19 DIAGNOSIS — Z0000 Encounter for general adult medical examination without abnormal findings: Secondary | ICD-10-CM | POA: Diagnosis not present

## 2019-08-19 DIAGNOSIS — Z1231 Encounter for screening mammogram for malignant neoplasm of breast: Secondary | ICD-10-CM

## 2019-08-19 DIAGNOSIS — R739 Hyperglycemia, unspecified: Secondary | ICD-10-CM | POA: Diagnosis not present

## 2019-08-19 DIAGNOSIS — E039 Hypothyroidism, unspecified: Secondary | ICD-10-CM

## 2019-08-19 DIAGNOSIS — Z23 Encounter for immunization: Secondary | ICD-10-CM | POA: Diagnosis not present

## 2019-08-19 DIAGNOSIS — R0789 Other chest pain: Secondary | ICD-10-CM

## 2019-08-19 DIAGNOSIS — Z0001 Encounter for general adult medical examination with abnormal findings: Secondary | ICD-10-CM | POA: Diagnosis not present

## 2019-08-19 LAB — CBC WITH DIFFERENTIAL/PLATELET
Basophils Absolute: 0 10*3/uL (ref 0.0–0.1)
Basophils Relative: 0.4 % (ref 0.0–3.0)
Eosinophils Absolute: 0.1 10*3/uL (ref 0.0–0.7)
Eosinophils Relative: 1.8 % (ref 0.0–5.0)
HCT: 42.3 % (ref 36.0–46.0)
Hemoglobin: 14.1 g/dL (ref 12.0–15.0)
Lymphocytes Relative: 39.4 % (ref 12.0–46.0)
Lymphs Abs: 2.6 10*3/uL (ref 0.7–4.0)
MCHC: 33.4 g/dL (ref 30.0–36.0)
MCV: 88.2 fl (ref 78.0–100.0)
Monocytes Absolute: 0.4 10*3/uL (ref 0.1–1.0)
Monocytes Relative: 6.3 % (ref 3.0–12.0)
Neutro Abs: 3.4 10*3/uL (ref 1.4–7.7)
Neutrophils Relative %: 52.1 % (ref 43.0–77.0)
Platelets: 372 10*3/uL (ref 150.0–400.0)
RBC: 4.8 Mil/uL (ref 3.87–5.11)
RDW: 13.3 % (ref 11.5–15.5)
WBC: 6.5 10*3/uL (ref 4.0–10.5)

## 2019-08-19 LAB — BASIC METABOLIC PANEL
BUN: 13 mg/dL (ref 6–23)
CO2: 29 mEq/L (ref 19–32)
Calcium: 9.9 mg/dL (ref 8.4–10.5)
Chloride: 103 mEq/L (ref 96–112)
Creatinine, Ser: 0.84 mg/dL (ref 0.40–1.20)
GFR: 68.21 mL/min (ref >60.00)
Glucose, Bld: 97 mg/dL (ref 70–99)
Potassium: 4.3 mEq/L (ref 3.5–5.1)
Sodium: 139 mEq/L (ref 135–145)

## 2019-08-19 LAB — HEPATIC FUNCTION PANEL
ALT: 10 U/L (ref 0–35)
AST: 11 U/L (ref 0–37)
Albumin: 4.5 g/dL (ref 3.5–5.2)
Alkaline Phosphatase: 65 U/L (ref 39–117)
Bilirubin, Direct: 0.1 mg/dL (ref 0.0–0.3)
Total Bilirubin: 1.1 mg/dL (ref 0.2–1.2)
Total Protein: 7.5 g/dL (ref 6.0–8.3)

## 2019-08-19 LAB — LIPID PANEL
Cholesterol: 234 mg/dL — ABNORMAL HIGH (ref 0–200)
HDL: 47.5 mg/dL (ref >39.00)
LDL Cholesterol: 159 mg/dL — ABNORMAL HIGH (ref 0–99)
NonHDL: 186.14
Total CHOL/HDL Ratio: 5
Triglycerides: 134 mg/dL (ref 0.0–149.0)
VLDL: 26.8 mg/dL (ref 0.0–40.0)

## 2019-08-19 LAB — HEMOGLOBIN A1C: Hgb A1c MFr Bld: 5.8 % (ref 4.6–6.5)

## 2019-08-19 LAB — TSH: TSH: 1.32 u[IU]/mL (ref 0.35–4.50)

## 2019-08-19 NOTE — Patient Instructions (Signed)
Health Maintenance, Female Adopting a healthy lifestyle and getting preventive care are important in promoting health and wellness. Ask your health care provider about:  The right schedule for you to have regular tests and exams.  Things you can do on your own to prevent diseases and keep yourself healthy. What should I know about diet, weight, and exercise? Eat a healthy diet   Eat a diet that includes plenty of vegetables, fruits, low-fat dairy products, and lean protein.  Do not eat a lot of foods that are high in solid fats, added sugars, or sodium. Maintain a healthy weight Body mass index (BMI) is used to identify weight problems. It estimates body fat based on height and weight. Your health care provider can help determine your BMI and help you achieve or maintain a healthy weight. Get regular exercise Get regular exercise. This is one of the most important things you can do for your health. Most adults should:  Exercise for at least 150 minutes each week. The exercise should increase your heart rate and make you sweat (moderate-intensity exercise).  Do strengthening exercises at least twice a week. This is in addition to the moderate-intensity exercise.  Spend less time sitting. Even light physical activity can be beneficial. Watch cholesterol and blood lipids Have your blood tested for lipids and cholesterol at 64 years of age, then have this test every 5 years. Have your cholesterol levels checked more often if:  Your lipid or cholesterol levels are high.  You are older than 64 years of age.  You are at high risk for heart disease. What should I know about cancer screening? Depending on your health history and family history, you may need to have cancer screening at various ages. This may include screening for:  Breast cancer.  Cervical cancer.  Colorectal cancer.  Skin cancer.  Lung cancer. What should I know about heart disease, diabetes, and high blood  pressure? Blood pressure and heart disease  High blood pressure causes heart disease and increases the risk of stroke. This is more likely to develop in people who have high blood pressure readings, are of African descent, or are overweight.  Have your blood pressure checked: ? Every 3-5 years if you are 18-39 years of age. ? Every year if you are 40 years old or older. Diabetes Have regular diabetes screenings. This checks your fasting blood sugar level. Have the screening done:  Once every three years after age 40 if you are at a normal weight and have a low risk for diabetes.  More often and at a younger age if you are overweight or have a high risk for diabetes. What should I know about preventing infection? Hepatitis B If you have a higher risk for hepatitis B, you should be screened for this virus. Talk with your health care provider to find out if you are at risk for hepatitis B infection. Hepatitis C Testing is recommended for:  Everyone born from 1945 through 1965.  Anyone with known risk factors for hepatitis C. Sexually transmitted infections (STIs)  Get screened for STIs, including gonorrhea and chlamydia, if: ? You are sexually active and are younger than 64 years of age. ? You are older than 64 years of age and your health care provider tells you that you are at risk for this type of infection. ? Your sexual activity has changed since you were last screened, and you are at increased risk for chlamydia or gonorrhea. Ask your health care provider if   you are at risk.  Ask your health care provider about whether you are at high risk for HIV. Your health care provider may recommend a prescription medicine to help prevent HIV infection. If you choose to take medicine to prevent HIV, you should first get tested for HIV. You should then be tested every 3 months for as long as you are taking the medicine. Pregnancy  If you are about to stop having your period (premenopausal) and  you may become pregnant, seek counseling before you get pregnant.  Take 400 to 800 micrograms (mcg) of folic acid every day if you become pregnant.  Ask for birth control (contraception) if you want to prevent pregnancy. Osteoporosis and menopause Osteoporosis is a disease in which the bones lose minerals and strength with aging. This can result in bone fractures. If you are 65 years old or older, or if you are at risk for osteoporosis and fractures, ask your health care provider if you should:  Be screened for bone loss.  Take a calcium or vitamin D supplement to lower your risk of fractures.  Be given hormone replacement therapy (HRT) to treat symptoms of menopause. Follow these instructions at home: Lifestyle  Do not use any products that contain nicotine or tobacco, such as cigarettes, e-cigarettes, and chewing tobacco. If you need help quitting, ask your health care provider.  Do not use street drugs.  Do not share needles.  Ask your health care provider for help if you need support or information about quitting drugs. Alcohol use  Do not drink alcohol if: ? Your health care provider tells you not to drink. ? You are pregnant, may be pregnant, or are planning to become pregnant.  If you drink alcohol: ? Limit how much you use to 0-1 drink a day. ? Limit intake if you are breastfeeding.  Be aware of how much alcohol is in your drink. In the [place] one drink equals one 12 oz bottle of beer (355 mL), one 5 oz glass of wine (148 mL), or one 1 oz glass of hard liquor (44 mL). General instructions  Schedule regular health, dental, and eye exams.  Stay current with your vaccines.  Tell your health care provider if: ? You often feel depressed. ? You have ever been abused or do not feel safe at home. Summary  Adopting a healthy lifestyle and getting preventive care are important in promoting health and wellness.  Follow your health care provider's instructions about healthy  diet, exercising, and getting tested or screened for diseases.  Follow your health care provider's instructions on monitoring your cholesterol and blood pressure. This information is not intended to replace advice given to you by your health care provider. Make sure you discuss any questions you have with your health care provider. Document Released: 05/16/2011 Document Revised: 10/24/2018 Document Reviewed: 10/24/2018 Elsevier Patient Education  2020 [employer]  

## 2019-08-19 NOTE — Progress Notes (Signed)
Subjective:  Patient ID: AMAIRANI Ramirez, female    DOB: Sep 27, 1956  Age: 63 y.o. MRN: [id number]  CC: Annual Exam, Chest Pain, and Hypothyroidism   HPI Toni Ramirez presents for a CPX.  She has had recurrent episodes of what she describes as chest pain for at least 15 years.  This is usually related to emotional stress.  It is a dull sensation that radiates up into her jaw.  It always occurs at rest.  The discomfort occurs about once a month.  She denies DOE, diaphoresis, shortness of breath, cough, hemoptysis, heartburn, indigestion, odynophagia, or dysphagia.  She has intentionally lost weight with lifestyle modifications.  Outpatient Medications Prior to Visit  Medication Sig Dispense Refill   levothyroxine (SYNTHROID, LEVOTHROID) 125 MCG tablet   5   ALPRAZolam (XANAX) 0.5 MG tablet Take 1 tablet (0.5 mg total) by mouth at bedtime as needed for anxiety. 30 tablet 2   HYDROcodone-homatropine (HYCODAN) 5-1.5 MG/5ML syrup Take 5 mLs by mouth every 8 (eight) hours as needed for cough. 120 mL 0   SYNTHROID 112 MCG tablet      No facility-administered medications prior to visit.     ROS Review of Systems  Constitutional: Negative for appetite change, diaphoresis, fatigue and unexpected weight change.  HENT: Negative for sore throat, trouble swallowing and voice change.   Eyes: Negative.   Respiratory: Negative for cough, chest tightness, shortness of breath and wheezing.   Cardiovascular: Positive for chest pain. Negative for palpitations and leg swelling.  Gastrointestinal: Negative for abdominal pain, blood in stool, constipation, diarrhea, nausea and vomiting.  Endocrine: Negative.   Genitourinary: Negative.  Negative for difficulty urinating, dysuria, frequency, hematuria and urgency.  Musculoskeletal: Negative.  Negative for arthralgias, myalgias and neck pain.  Skin: Negative.  Negative for color change.  Neurological: Negative.  Negative for dizziness, weakness and  light-headedness.  Hematological: Negative for adenopathy. Does not bruise/bleed easily.  Psychiatric/Behavioral: Negative.     Objective:  BP 124/64 (BP Location: Left Arm, Patient Position: Sitting, Cuff Size: Large)    Pulse (!) 57    Temp 98.1 F (36.7 C) (Oral)    Resp 16    Ht 5\' 5"  (1.651 m)    Wt 191 lb (86.6 kg)    LMP 06/14/2010    SpO2 98%    BMI 31.78 kg/m   BP Readings from Last 3 Encounters:  08/19/19 124/64  03/21/17 130/82  02/27/17 118/70    Wt Readings from Last 3 Encounters:  08/19/19 191 lb (86.6 kg)  03/21/17 197 lb (89.4 kg)  02/27/17 200 lb (90.7 kg)    Physical Exam Vitals signs reviewed.  Constitutional:      General: She is not in acute distress.    Appearance: She is well-developed. She is not ill-appearing, toxic-appearing or diaphoretic.  HENT:     Nose: Nose normal.     Mouth/Throat:     Mouth: Mucous membranes are moist.  Eyes:     General: No scleral icterus.    Conjunctiva/sclera: Conjunctivae normal.  Neck:     Musculoskeletal: Neck supple. No muscular tenderness.     Thyroid: No thyromegaly.  Cardiovascular:     Rate and Rhythm: Normal rate and regular rhythm.     Heart sounds: No murmur. No friction rub. No gallop.      Comments: EKG ---  Sinus  Bradycardia  Low voltage -possible pulmonary disease.   ABNORMAL  Pulmonary:     Effort: Pulmonary effort is  normal.     Breath sounds: No stridor. No wheezing, rhonchi or rales.  Abdominal:     General: Abdomen is flat. Bowel sounds are normal. There is no distension.     Palpations: Abdomen is soft. There is no hepatomegaly or splenomegaly.     Tenderness: There is no abdominal tenderness.  Musculoskeletal:     Right lower leg: No edema.     Left lower leg: No edema.  Lymphadenopathy:     Cervical: No cervical adenopathy.  Skin:    General: Skin is warm and dry.     Coloration: Skin is not pale.     Findings: No rash.  Neurological:     General: No focal deficit present.      Mental Status: She is alert.  Psychiatric:        Mood and Affect: Mood normal.        Behavior: Behavior normal.     Lab Results  Component Value Date   WBC 6.5 08/19/2019   HGB 14.1 08/19/2019   HCT 42.3 08/19/2019   PLT 372.0 08/19/2019   GLUCOSE 97 08/19/2019   CHOL 234 (H) 08/19/2019   TRIG 134.0 08/19/2019   HDL 47.50 08/19/2019   LDLCALC 159 (H) 08/19/2019   ALT 10 08/19/2019   AST 11 08/19/2019   NA 139 08/19/2019   K 4.3 08/19/2019   CL 103 08/19/2019   CREATININE 0.84 08/19/2019   BUN 13 08/19/2019   CO2 29 08/19/2019   TSH 1.32 08/19/2019   HGBA1C 5.8 08/19/2019    Nm Whole Body I131 Scan W/thyrogen  Result Date: 06/23/2017 CLINICAL DATA:  Thyroid cancer post thyroidectomy and postoperative radioactive iodine ablation EXAM: THYROGEN-STIMULATED I-131 WHOLE BODY SCAN TECHNIQUE: The patient received 0.9 mg Thyrogen intramuscularly every 24 hours for two doses. On the third day the patient returned and received the radiopharmaceutical, per orally. On the fifth day, the patient returned and whole body planar images were obtained in the anterior and posterior projections. RADIOPHARMACEUTICALS:  4.3 mCi I-131 sodium iodide orally COMPARISON:  08/05/2016 ; correlation CT chest 09/05/2016 FINDINGS: No abnormal tracer uptake in the cervical region. Focus of tracer accumulation at lower LEFT chest/LEFT upper quadrant again seen, unchanged. No evidence of tumor recurrence was seen at this site on the interval CT chest exam; this most likely represents tracer accumulation within the gastric fundus rather than the inferior LEFT lung. Excreted tracer within colon and urinary bladder. No additional sites of abnormal radio iodine accumulation are identified to suggest iodine-avid metastatic thyroid cancer. IMPRESSION: No definite scintigraphic evidence of iodine-avid metastatic thyroid cancer. Electronically Signed   By: Lavonia Dana M.D.   On: 06/23/2017 12:19    Assessment & Plan:    Toni Ramirez was seen today for annual exam, chest pain and hypothyroidism.  Diagnoses and all orders for this visit:  Acquired hypothyroidism- Her TSH is in the normal range.  I recommend she stay on the current dose of levothyroxine. -     CBC with Differential/Platelet; Future -     Basic metabolic panel; Future -     TSH; Future -     Hepatic function panel; Future  Routine general medical examination at a health care facility- Exam completed, labs reviewed -she has a low ASCVD risk score so I did not recommend a statin for CV risk reduction, vaccines reviewed and updated, she is referred for a screening mammogram, colon cancer screening is up-to-date, she had a Pap smear 2 years ago. -  Lipid panel; Future -     Hepatitis C antibody; Future -     HIV Antibody (routine testing w rflx); Future  Hyperglycemia- Her A1c is 5.8%.  Medical therapy is not indicated.  She is working on her lifestyle modifications. -     Hemoglobin A1c; Future -     Hepatic function panel; Future  Need for influenza vaccination -     Flu Vaccine QUAD 36+ mos IM  Need for shingles vaccine -     Varicella-zoster vaccine IM (Shingrix)  Atypical chest pain- Based on the chronicity and paucity of symptoms, a normal EKG, and a normal physical examination I think the chest pain is caused by emotional stress.  I have offered her reassurance. -     EKG 12-Lead   I have discontinued Octavio Manns "Sharlene Dory Bankert"'s ALPRAZolam, HYDROcodone-homatropine, and Synthroid. I am also having her maintain her levothyroxine.  No orders of the defined types were placed in this encounter.    Follow-up: Return in about 6 months (around 02/17/2020).  Toni Calico, MD

## 2019-08-20 ENCOUNTER — Encounter: Payer: Self-pay | Admitting: Internal Medicine

## 2019-08-20 LAB — HEPATITIS C ANTIBODY
Hepatitis C Ab: NONREACTIVE
SIGNAL TO CUT-OFF: 0.01 (ref <1.00)

## 2019-08-20 LAB — HIV ANTIBODY (ROUTINE TESTING W REFLEX): HIV 1&2 Ab, 4th Generation: NONREACTIVE

## 2019-08-21 LAB — HM MAMMOGRAPHY

## 2019-09-04 ENCOUNTER — Other Ambulatory Visit: Payer: Self-pay

## 2019-09-04 DIAGNOSIS — Z20822 Contact with and (suspected) exposure to covid-19: Secondary | ICD-10-CM

## 2019-09-05 LAB — NOVEL CORONAVIRUS, NAA: SARS-CoV-2, NAA: NOT DETECTED

## 2020-01-23 ENCOUNTER — Ambulatory Visit: Payer: BC Managed Care – PPO | Admitting: Internal Medicine

## 2020-01-23 ENCOUNTER — Ambulatory Visit (INDEPENDENT_AMBULATORY_CARE_PROVIDER_SITE_OTHER): Payer: BC Managed Care – PPO

## 2020-01-23 ENCOUNTER — Encounter: Payer: Self-pay | Admitting: Internal Medicine

## 2020-01-23 ENCOUNTER — Other Ambulatory Visit: Payer: Self-pay

## 2020-01-23 VITALS — BP 110/72 | HR 70 | Temp 98.1°F | Resp 16 | Ht 65.0 in | Wt 192.0 lb

## 2020-01-23 DIAGNOSIS — G8929 Other chronic pain: Secondary | ICD-10-CM

## 2020-01-23 DIAGNOSIS — M25512 Pain in left shoulder: Secondary | ICD-10-CM

## 2020-01-23 DIAGNOSIS — E039 Hypothyroidism, unspecified: Secondary | ICD-10-CM | POA: Diagnosis not present

## 2020-01-23 LAB — TSH: TSH: 1.19 u[IU]/mL (ref 0.35–4.50)

## 2020-01-23 NOTE — Patient Instructions (Signed)
Shoulder Pain Many things can cause shoulder pain, including:  An injury to the shoulder.  Overuse of the shoulder.  Arthritis. The source of the pain can be:  Inflammation.  An injury to the shoulder joint.  An injury to a tendon, ligament, or bone. Follow these instructions at home: Pay attention to changes in your symptoms. Let your health care provider know about them. Follow these instructions to relieve your pain. If you have a sling:  Wear the sling as told by your health care provider. Remove it only as told by your health care provider.  Loosen the sling if your fingers tingle, become numb, or turn cold and blue.  Keep the sling clean.  If the sling is not waterproof: ? Do not let it get wet. Remove it to shower or bathe.  Move your arm as little as possible, but keep your hand moving to prevent swelling. Managing pain, stiffness, and swelling   If directed, put ice on the painful area: ? Put ice in a plastic bag. ? Place a towel between your skin and the bag. ? Leave the ice on for 20 minutes, 2-3 times per day. Stop applying ice if it does not help with the pain.  Squeeze a soft ball or a foam pad as much as possible. This helps to keep the shoulder from swelling. It also helps to strengthen the arm. General instructions  Take over-the-counter and prescription medicines only as told by your health care provider.  Keep all follow-up visits as told by your health care provider. This is important. Contact a health care provider if:  Your pain gets worse.  Your pain is not relieved with medicines.  New pain develops in your arm, hand, or fingers. Get help right away if:  Your arm, hand, or fingers: ? Tingle. ? Become numb. ? Become swollen. ? Become painful. ? Turn white or blue. Summary  Shoulder pain can be caused by an injury, overuse, or arthritis.  Pay attention to changes in your symptoms. Let your health care provider know about  them.  This condition may be treated with a sling, ice, and pain medicines.  Contact your health care provider if the pain gets worse or new pain develops. Get help right away if your arm, hand, or fingers tingle or become numb, swollen, or painful.  Keep all follow-up visits as told by your health care provider. This is important. This information is not intended to replace advice given to you by your health care provider. Make sure you discuss any questions you have with your health care provider. Document Revised: 05/15/2018 Document Reviewed: 05/15/2018 Elsevier Patient Education  2020 [employer]  

## 2020-01-23 NOTE — Progress Notes (Signed)
Subjective:  Patient ID: Toni Ramirez, female    DOB: 18-Sep-1956  Age: 64 y.o. MRN: [id number]  CC: Hypothyroidism  This visit occurred during the SARS-CoV-2 public health emergency.  Safety protocols were in place, including screening questions prior to the visit, additional usage of staff PPE, and extensive cleaning of exam room while observing appropriate contact time as indicated for disinfecting solutions.    HPI Toni Ramirez presents for f/up - She complains of a 2-year history of vague aching discomfort in her left upper arm and left shoulder.  Over the years she has developed decrease of range of motion in her left shoulder.  She has not gotten much symptom relief with physical therapy.  She also complains of an intermittent electrical shock radiating through her left upper arm.  She gets minimal symptom relief with Motrin.  She denies neck pain or paresthesias.  She has never had a neck or shoulder injury.  Outpatient Medications Prior to Visit  Medication Sig Dispense Refill  . SYNTHROID 112 MCG tablet Take 112 mcg by mouth daily.    Marland Kitchen levothyroxine (SYNTHROID, LEVOTHROID) 125 MCG tablet   5   No facility-administered medications prior to visit.    ROS Review of Systems  Constitutional: Negative for diaphoresis, fatigue and unexpected weight change.  HENT: Negative.   Eyes: Negative.   Respiratory: Negative for chest tightness, shortness of breath and wheezing.   Cardiovascular: Negative for chest pain, palpitations and leg swelling.  Gastrointestinal: Negative for abdominal pain, constipation, diarrhea, nausea and vomiting.  Endocrine: Negative for cold intolerance and heat intolerance.  Genitourinary: Negative.  Negative for difficulty urinating.  Musculoskeletal: Positive for arthralgias.  Skin: Negative.   Neurological: Negative.  Negative for dizziness, weakness and light-headedness.  Hematological: Negative for adenopathy. Does not bruise/bleed easily.    Psychiatric/Behavioral: Negative.     Objective:  BP 110/72 (BP Location: Left Arm, Patient Position: Sitting, Cuff Size: Large)   Pulse 70   Temp 98.1 F (36.7 C) (Oral)   Resp 16   Ht 5\' 5"  (1.651 m)   Wt 192 lb (87.1 kg)   LMP 06/14/2010   SpO2 96%   BMI 31.95 kg/m   BP Readings from Last 3 Encounters:  01/23/20 110/72  08/19/19 124/64  03/21/17 130/82    Wt Readings from Last 3 Encounters:  01/23/20 192 lb (87.1 kg)  08/19/19 191 lb (86.6 kg)  03/21/17 197 lb (89.4 kg)    Physical Exam Vitals reviewed.  Constitutional:      Appearance: Normal appearance.  HENT:     Nose: Nose normal.     Mouth/Throat:     Mouth: Mucous membranes are moist.  Eyes:     General: No scleral icterus.    Conjunctiva/sclera: Conjunctivae normal.  Neck:     Thyroid: No thyroid mass, thyromegaly or thyroid tenderness.  Cardiovascular:     Rate and Rhythm: Normal rate and regular rhythm.     Heart sounds: No murmur.  Pulmonary:     Effort: Pulmonary effort is normal.     Breath sounds: No stridor. No wheezing, rhonchi or rales.  Abdominal:     General: Abdomen is protuberant. Bowel sounds are normal. There is no distension.     Palpations: Abdomen is soft. There is no hepatomegaly, splenomegaly or mass.  Musculoskeletal:     Right shoulder: Normal.     Left shoulder: No swelling, deformity, tenderness or bony tenderness. Decreased range of motion.     Left  upper arm: Normal. No swelling, edema, deformity, tenderness or bony tenderness.     Cervical back: Neck supple.     Right lower leg: No edema.     Left lower leg: No edema.     Comments: There are positive abnormal rotator cuff signs in the left shoulder  Lymphadenopathy:     Cervical: No cervical adenopathy.  Skin:    General: Skin is warm and dry.  Neurological:     General: No focal deficit present.     Mental Status: She is alert.  Psychiatric:        Mood and Affect: Mood normal.        Behavior: Behavior normal.      Lab Results  Component Value Date   WBC 6.5 08/19/2019   HGB 14.1 08/19/2019   HCT 42.3 08/19/2019   PLT 372.0 08/19/2019   GLUCOSE 97 08/19/2019   CHOL 234 (H) 08/19/2019   TRIG 134.0 08/19/2019   HDL 47.50 08/19/2019   LDLCALC 159 (H) 08/19/2019   ALT 10 08/19/2019   AST 11 08/19/2019   NA 139 08/19/2019   K 4.3 08/19/2019   CL 103 08/19/2019   CREATININE 0.84 08/19/2019   BUN 13 08/19/2019   CO2 29 08/19/2019   TSH 1.19 01/23/2020   HGBA1C 5.8 08/19/2019    NM Whole Body I131 Scan W/Thyrogen  Result Date: 06/23/2017 CLINICAL DATA:  Thyroid cancer post thyroidectomy and postoperative radioactive iodine ablation EXAM: THYROGEN-STIMULATED I-131 WHOLE BODY SCAN TECHNIQUE: The patient received 0.9 mg Thyrogen intramuscularly every 24 hours for two doses. On the third day the patient returned and received the radiopharmaceutical, per orally. On the fifth day, the patient returned and whole body planar images were obtained in the anterior and posterior projections. RADIOPHARMACEUTICALS:  4.3 mCi I-131 sodium iodide orally COMPARISON:  08/05/2016 ; correlation CT chest 09/05/2016 FINDINGS: No abnormal tracer uptake in the cervical region. Focus of tracer accumulation at lower LEFT chest/LEFT upper quadrant again seen, unchanged. No evidence of tumor recurrence was seen at this site on the interval CT chest exam; this most likely represents tracer accumulation within the gastric fundus rather than the inferior LEFT lung. Excreted tracer within colon and urinary bladder. No additional sites of abnormal radio iodine accumulation are identified to suggest iodine-avid metastatic thyroid cancer. IMPRESSION: No definite scintigraphic evidence of iodine-avid metastatic thyroid cancer. Electronically Signed   By: Lavonia Dana M.D.   On: 06/23/2017 12:19    DG Shoulder Left  Result Date: 01/23/2020 CLINICAL DATA:  Chronic left shoulder pain without recent injury. EXAM: LEFT SHOULDER - 2+ VIEW  COMPARISON:  None. FINDINGS: There is no evidence of fracture or dislocation. There is no evidence of arthropathy or other focal bone abnormality. Soft tissues are unremarkable. IMPRESSION: Negative. Electronically Signed   By: Marijo Conception M.D.   On: 01/23/2020 14:32    Assessment & Plan:   Rica was seen today for hypothyroidism.  Diagnoses and all orders for this visit:  Acquired hypothyroidism- Her TSH is in the normal range.  She remain on the current dose of levothyroxine. -     TSH -     SYNTHROID 112 MCG tablet; Take 1 tablet (112 mcg total) by mouth daily.  Chronic left shoulder pain- Plain films are normal.  Examination is concerning for rotator cuff abnormality.  I have asked her to see sports medicine about this. -     DG Shoulder Left; Future -     Ambulatory  referral to Sports Medicine   I have discontinued Octavio Manns "Sharlene Dory Messman"'s levothyroxine. I have also changed her Synthroid.  Meds ordered this encounter  Medications  . SYNTHROID 112 MCG tablet    Sig: Take 1 tablet (112 mcg total) by mouth daily.    Dispense:  90 tablet    Refill:  1     Follow-up: Return in about 6 months (around 07/25/2020).  Scarlette Calico, MD

## 2020-01-25 MED ORDER — SYNTHROID 112 MCG PO TABS: 112.0000 ug | ORAL_TABLET | Freq: Every day | ORAL | 1 refills | Status: DC

## 2020-02-10 ENCOUNTER — Other Ambulatory Visit: Payer: Self-pay

## 2020-02-10 ENCOUNTER — Encounter: Payer: Self-pay | Admitting: Family Medicine

## 2020-02-10 ENCOUNTER — Ambulatory Visit: Payer: BC Managed Care – PPO | Admitting: Family Medicine

## 2020-02-10 ENCOUNTER — Ambulatory Visit: Payer: Self-pay

## 2020-02-10 VITALS — BP 112/80 | HR 83 | Ht 65.0 in | Wt 190.0 lb

## 2020-02-10 DIAGNOSIS — M75112 Incomplete rotator cuff tear or rupture of left shoulder, not specified as traumatic: Secondary | ICD-10-CM

## 2020-02-10 DIAGNOSIS — M25512 Pain in left shoulder: Secondary | ICD-10-CM

## 2020-02-10 MED ORDER — DICLOFENAC SODIUM 2 % EX SOLN: 2.0000 g | Freq: Two times a day (BID) | CUTANEOUS | 3 refills | Status: DC

## 2020-02-10 MED ORDER — VITAMIN D (ERGOCALCIFEROL) 1.25 MG (50000 UNIT) PO CAPS: 50000.0000 [IU] | ORAL_CAPSULE | ORAL | 0 refills | Status: DC

## 2020-02-10 NOTE — Patient Instructions (Signed)
Good to see you.  Ice 20 minutes 2 times daily. Usually after activity and before bed. Exercises 3 times a week.  pennsaid pinkie amount topically 2 times daily as needed.  Once weekly vitamin D for 12 weeks.  See me again in 5-6 weeks

## 2020-02-10 NOTE — Progress Notes (Signed)
[place] Phone: [phone] Subjective:   Toni Ramirez, am serving as a scribe for Dr. Hulan Saas. This visit occurred during the SARS-CoV-2 public health emergency.  Safety protocols were in place, including screening questions prior to the visit, additional usage of staff PPE, and extensive cleaning of exam room while observing appropriate contact time as indicated for disinfecting solutions.   I'm seeing this patient by the request  of:  Toni Lima, MD  CC: Left shoulder pain  [id number]  Toni Ramirez is a 64 y.o. female coming in with complaint of left shoulder pain. Pain started in 07/2018. Pain over anterior aspect that radiates down in bicep. Patient unable to perform full flexion or ER. Has tried physical therapy but stopped during COVID. Has been resting since then.  Patient has had pain and plastic repair of at night.  Has noticed recently has started having decreasing range of motion again.  Patient did previously have x-rays done that were independently visualized by me x-rays do show mild arthritic changes noted of the acromioclavicular joint but none of the glenohumeral joint otherwise unremarkable rates the severity of pain recently is 8 out of 10.  Affecting daily activities and waking her up at night.      Past Medical History:  Diagnosis Date  . Anginal pain ([place])    "because of anxiety"- none significant episode  . Anxiety   . Arthritis   . Cancer [facility])    thyroid cancer dx. 12-19-13,further surgery planned   Past Surgical History:  Procedure Laterality Date  . [place], 2003  . THYROID LOBECTOMY Right 12/20/2013   Procedure: RIGHT THYROID LOBECTOMY;  Surgeon: Shann Medal, MD;  Location: [facility];  Service: General;  Laterality: Right;  . THYROIDECTOMY N/A 12/25/2013   Procedure: COMPLETION THYROIDECTOMY;  Surgeon: Shann Medal, MD;  Location: [facility]  [facility];  Service: General;  Laterality: N/A;  . TONSILLECTOMY     Social History   Socioeconomic History  . Marital status: Widowed    Spouse name: Not on file  . Number of children: Not on file  . Years of education: Not on file  . Highest education level: Not on file  Occupational History  . Not on file  Tobacco Use  . Smoking status: Never Smoker  . Smokeless tobacco: Never Used  Substance and Sexual Activity  . Alcohol use: Yes    Comment: social- less 1 per week  . Drug use: Ramirez  . Sexual activity: Not Currently  Other Topics Concern  . Not on file  Social History Narrative  . Not on file   Social Determinants of Health   Financial Resource Strain:   . Difficulty of Paying Living Expenses:   Food Insecurity:   . Worried About Charity fundraiser in the Last Year:   . Arboriculturist in the Last Year:   Transportation Needs:   . Film/video editor (Medical):   Marland Kitchen Lack of Transportation (Non-Medical):   Physical Activity:   . Days of Exercise per Week:   . Minutes of Exercise per Session:   Stress:   . Feeling of Stress :   Social Connections:   . Frequency of Communication with Friends and Family:   . Frequency of Social Gatherings with Friends and Family:   . Attends Religious Services:   . Active Member of Clubs or Organizations:   .  Attends Archivist Meetings:   Marland Kitchen Marital Status:    Ramirez Known Allergies Family History  Problem Relation Age of Onset  . Breast cancer Mother   . Cancer Mother        uterine, breast  . Alcohol abuse Neg Hx   . Depression Neg Hx   . Diabetes Neg Hx   . Arthritis Neg Hx   . Heart disease Neg Hx   . Hyperlipidemia Neg Hx   . Hypertension Neg Hx   . Kidney disease Neg Hx   . Stroke Neg Hx     Current Outpatient Medications (Endocrine & Metabolic):  .  SYNTHROID 112 MCG tablet, Take 1 tablet (112 mcg total) by mouth daily.      Current Outpatient Medications (Other):  Marland Kitchen  Diclofenac Sodium 2 % SOLN, Place  2 g onto the skin 2 (two) times daily. .  Vitamin D, Ergocalciferol, (DRISDOL) 1.25 MG (50000 UNIT) CAPS capsule, Take 1 capsule (50,000 Units total) by mouth every 7 (seven) days.   Reviewed prior external information including notes and imaging from  primary care provider As well as notes that were available from care everywhere and other healthcare systems.  Past medical history, social, surgical and family history all reviewed in electronic medical record.  Ramirez pertanent information unless stated regarding to the chief complaint.   Review of Systems:  Ramirez headache, visual changes, nausea, vomiting, diarrhea, constipation, dizziness, abdominal pain, skin rash, fevers, chills, night sweats, weight loss, swollen lymph nodes, body aches, joint swelling, chest pain, shortness of breath, mood changes. POSITIVE muscle aches  Objective  Blood pressure 112/80, pulse 83, height 5\' 5"  (1.651 m), weight 190 lb (86.2 kg), last menstrual period 06/14/2010, SpO2 99 %.   General: Ramirez apparent distress alert and oriented x3 mood and affect normal, dressed appropriately.  HEENT: Pupils equal, extraocular movements intact  Respiratory: Patient's speak in full sentences and does not appear short of breath  Cardiovascular: Ramirez lower extremity edema, non tender, Ramirez erythema  Neuro: Cranial nerves II through XII are intact, neurovascularly intact in all extremities with 2+ DTRs and 2+ pulses.  Gait normal with good balance and coordination.  MSK:  Non tender with full range of motion and good stability and symmetric strength and tone of  elbows, wrist, hip, knee and ankles bilaterally.  Left shoulder shows the patient does have decreased range of motion in all planes of at least 10 to 20 degrees.  Patient does have 5 degrees of external rotation.  Mild positive speeds.  Positive Hawkins noted.  Patient does have weakness of the rotator cuff especially with the subscapularis against resisted external rotation.  And  internal rotation.  Limited musculoskeletal ultrasound was performed and interpreted by Lyndal Pulley  Limited ultrasound of patient's left shoulder shows the patient does have some thickening of the anterior capsule anteriorly subscapularis does have a partial-thickness tear noted.  Patient does have hypoechoic changes of the bicep tendon.  Questionable irregularity of the anterior labrum noted.  Supraspinatus is intact Ramirez. Impression: Frozen shoulder, questionable bicep tendinitis and questionable labral pathology  Procedure: Real-time Ultrasound Guided Injection of left glenohumeral joint Device: GE Logiq E  Ultrasound guided injection is preferred based studies that show increased duration, increased effect, greater accuracy, decreased procedural pain, increased response rate with ultrasound guided versus blind injection.  Verbal informed consent obtained.  Time-out conducted.  Noted Ramirez overlying erythema, induration, or other signs of local infection.  Skin prepped in a  sterile fashion.  Local anesthesia: Topical Ethyl chloride.  With sterile technique and under real time ultrasound guidance:  Joint visualized.  21g 2 inch needle inserted posterior approach. Pictures taken for needle placement. Patient did have injection of 2 cc of 0.5% Marcaine, and 1cc of Kenalog 40 mg/dL. Completed without difficulty  Pain immediately resolved suggesting accurate placement of the medication.  Advised to call if fevers/chills, erythema, induration, drainage, or persistent bleeding.  Images permanently stored and available for review in the ultrasound unit.  Impression: Technically successful ultrasound guided injection.  [id number]; 15 additional minutes spent for Therapeutic exercises as stated in above notes.  This included exercises focusing on stretching, strengthening, with significant focus on eccentric aspects.   Long term goals include an improvement in range of motion, strength, endurance as well as  avoiding reinjury. Patient's frequency would include in 1-2 times a day, 3-5 times a week for a duration of 6-12 weeks. Shoulder Exercises that included:  Basic scapular stabilization to include adduction and depression of scapula Scaption, focusing on proper movement and good control Internal and External rotation utilizing a theraband, with elbow tucked at side entire time Rows with theraband    Proper technique shown and discussed handout in great detail with ATC.  All questions were discussed and answered.     Impression and Recommendations:     This case required medical decision making of moderate complexity. The above documentation has been reviewed and is accurate and complete Lyndal Pulley, DO       Note: This dictation was prepared with Dragon dictation along with smaller phrase technology. Any transcriptional errors that result from this process are unintentional.

## 2020-02-10 NOTE — Assessment & Plan Note (Signed)
Patient given injection and tolerated the procedure well.  Discussed icing regimen and home exercise, discussed icing regimen.  Patient given once weekly vitamin D did help.  We also discussed topical anti-inflammatories which also was prescribed today.  Patient tries to limit the different medications.  Discussed range of motion and patient work with Product/process development scientist.  Follow-up again in 4 to 8 weeks.

## 2020-03-30 ENCOUNTER — Ambulatory Visit: Payer: Self-pay

## 2020-03-30 ENCOUNTER — Encounter: Payer: Self-pay | Admitting: Family Medicine

## 2020-03-30 ENCOUNTER — Ambulatory Visit: Payer: BC Managed Care – PPO | Admitting: Family Medicine

## 2020-03-30 ENCOUNTER — Other Ambulatory Visit: Payer: Self-pay

## 2020-03-30 VITALS — BP 102/78 | HR 60 | Ht 65.0 in | Wt 184.0 lb

## 2020-03-30 DIAGNOSIS — M19019 Primary osteoarthritis, unspecified shoulder: Secondary | ICD-10-CM | POA: Insufficient documentation

## 2020-03-30 DIAGNOSIS — G8929 Other chronic pain: Secondary | ICD-10-CM

## 2020-03-30 DIAGNOSIS — M25512 Pain in left shoulder: Secondary | ICD-10-CM | POA: Diagnosis not present

## 2020-03-30 DIAGNOSIS — M19012 Primary osteoarthritis, left shoulder: Secondary | ICD-10-CM

## 2020-03-30 DIAGNOSIS — M75112 Incomplete rotator cuff tear or rupture of left shoulder, not specified as traumatic: Secondary | ICD-10-CM

## 2020-03-30 NOTE — Patient Instructions (Signed)
Injected AC joint today See me in 6 weeks

## 2020-03-30 NOTE — Assessment & Plan Note (Signed)
Patient given injection and tolerated the procedure well, discussed icing regimen and home exercise, which activities to do which wants to avoid.  Patient is to increase activity slowly over the course the next several weeks.  Follow-up with me again in 8 weeks

## 2020-03-30 NOTE — Progress Notes (Signed)
Toni Ramirez [place] Phone: [phone] Subjective:   Toni Ramirez, am serving as a scribe for Dr. Hulan Saas. This visit occurred during the SARS-CoV-2 public health emergency.  Safety protocols were in place, including screening questions prior to the visit, additional usage of staff PPE, and extensive cleaning of exam room while observing appropriate contact time as indicated for disinfecting solutions.   I'm seeing this patient by the request  of:  Toni Lima, MD  CC: Left shoulder follow-up  [id number]   02/10/2020 Patient given injection and tolerated the procedure well.  Discussed icing regimen and home exercise, discussed icing regimen.  Patient given once weekly vitamin D did help.  We also discussed topical anti-inflammatories which also was prescribed today.  Patient tries to limit the different medications.  Discussed range of motion and patient work with Product/process development scientist.  Follow-up again in 4 to 8 weeks.  Update 03/30/2020 Toni Ramirez is a 64 y.o. female coming in with complaint of left shoulder pain. Patient states that her pain has improved. Is able to lie on left side with little pain.  Has some mild discomfort from time to time if she moves too quick.  Overall would state that she is feeling approximately 70 to 75% better.       Past Medical History:  Diagnosis Date  . Anginal pain ([place])    "because of anxiety"- none significant episode  . Anxiety   . Arthritis   . Cancer [facility])    thyroid cancer dx. 12-19-13,further surgery planned   Past Surgical History:  Procedure Laterality Date  . Toni Ramirez, 2003  . THYROID LOBECTOMY Right 12/20/2013   Procedure: RIGHT THYROID LOBECTOMY;  Surgeon: Shann Medal, MD;  Location: [facility];  Service: General;  Laterality: Right;  . THYROIDECTOMY N/A 12/25/2013   Procedure: COMPLETION THYROIDECTOMY;  Surgeon: Shann Medal, MD;   Location: [facility];  Service: General;  Laterality: N/A;  . TONSILLECTOMY     Social History   Socioeconomic History  . Marital status: Widowed    Spouse name: Not on file  . Number of children: Not on file  . Years of education: Not on file  . Highest education level: Not on file  Occupational History  . Not on file  Tobacco Use  . Smoking status: Never Smoker  . Smokeless tobacco: Never Used  Substance and Sexual Activity  . Alcohol use: Yes    Comment: social- less 1 per week  . Drug use: Ramirez  . Sexual activity: Not Currently  Other Topics Concern  . Not on file  Social History Narrative  . Not on file   Social Determinants of Health   Financial Resource Strain:   . Difficulty of Paying Living Expenses:   Food Insecurity:   . Worried About Charity fundraiser in the Last Year:   . Arboriculturist in the Last Year:   Transportation Needs:   . Film/video editor (Medical):   Marland Kitchen Lack of Transportation (Non-Medical):   Physical Activity:   . Days of Exercise per Week:   . Minutes of Exercise per Session:   Stress:   . Feeling of Stress :   Social Connections:   . Frequency of Communication with Friends and Family:   . Frequency of Social Gatherings with Friends and Family:   . Attends Religious Services:   . Active Member  of Clubs or Organizations:   . Attends Archivist Meetings:   Marland Kitchen Marital Status:    Ramirez Known Allergies Family History  Problem Relation Age of Onset  . Breast cancer Mother   . Cancer Mother        uterine, breast  . Alcohol abuse Neg Hx   . Depression Neg Hx   . Diabetes Neg Hx   . Arthritis Neg Hx   . Heart disease Neg Hx   . Hyperlipidemia Neg Hx   . Hypertension Neg Hx   . Kidney disease Neg Hx   . Stroke Neg Hx     Current Outpatient Medications (Endocrine & Metabolic):  .  SYNTHROID 112 MCG tablet, Take 1 tablet (112 mcg total) by mouth daily.      Current Outpatient Medications (Other):  Marland Kitchen  Diclofenac Sodium 2  % SOLN, Place 2 g onto the skin 2 (two) times daily. .  Vitamin D, Ergocalciferol, (DRISDOL) 1.25 MG (50000 UNIT) CAPS capsule, Take 1 capsule (50,000 Units total) by mouth every 7 (seven) days.   Reviewed prior external information including notes and imaging from  primary care provider As well as notes that were available from care everywhere and other healthcare systems.  Past medical history, social, surgical and family history all reviewed in electronic medical record.  Ramirez pertanent information unless stated regarding to the chief complaint.   Review of Systems:  Ramirez headache, visual changes, nausea, vomiting, diarrhea, constipation, dizziness, abdominal pain, skin rash, fevers, chills, night sweats, weight loss, swollen lymph nodes, body aches, joint swelling, chest pain, shortness of breath, mood changes. POSITIVE muscle aches  Objective  Blood pressure 102/78, pulse 60, height 5\' 5"  (1.651 m), weight 184 lb (83.5 kg), last menstrual period 06/14/2010, SpO2 98 %.   General: Ramirez apparent distress alert and oriented x3 mood and affect normal, dressed appropriately.  HEENT: Pupils equal, extraocular movements intact  Respiratory: Patient's speak in full sentences and does not appear short of breath  Cardiovascular: Ramirez lower extremity edema, non tender, Ramirez erythema  Neuro: Cranial nerves II through XII are intact, neurovascularly intact in all extremities with 2+ DTRs and 2+ pulses.  Gait normal with good balance and coordination.  MSK: Left shoulder exam shows the patient does have significant improvement in range of motion.  Very mild impingement noted.  5-5 strength of the rotator cuff noted.  Mild positive O'Brien's and positive crossover.  Procedure: Real-time Ultrasound Guided Injection of left AC joint Device: GE Logiq Q7 Ultrasound guided injection is preferred based studies that show increased duration, increased effect, greater accuracy, decreased procedural pain, increased  response rate, and decreased cost with ultrasound guided versus blind injection.  Verbal informed consent obtained.  Time-out conducted.  Noted Ramirez overlying erythema, induration, or other signs of local infection.  Skin prepped in a sterile fashion.  Local anesthesia: Topical Ethyl chloride.  With sterile technique and under real time ultrasound guidance: With a 25-gauge half inch needle injected with 0.5 cc of 0.5% Marcaine and 0.5 cc of Kenalog 40 mg/mL Completed without difficulty  Pain immediately resolved suggesting accurate placement of the medication.  Advised to call if fevers/chills, erythema, induration, drainage, or persistent bleeding.  Images permanently stored and available for review in the ultrasound unit.  Impression: Technically successful ultrasound guided injection.    Impression and Recommendations:     This case required medical decision making of moderate complexity. The above documentation has been reviewed and is accurate and complete Alroy Dust  Koren Bound, DO       Note: This dictation was prepared with Dragon dictation along with smaller phrase technology. Any transcriptional errors that result from this process are unintentional.

## 2020-03-30 NOTE — Assessment & Plan Note (Signed)
Overall seems to be doing better.  Mild acromioclavicular arthritis.  We discussed with patient in great length and she decided to do another injection.  Patient tolerated the procedure well.  We discussed continuing the range of motion.  I am optimistic patient will do very well with conservative therapy.  Follow-up with me again  8 weeks

## 2020-05-11 ENCOUNTER — Ambulatory Visit: Payer: Self-pay

## 2020-05-11 ENCOUNTER — Ambulatory Visit: Payer: BC Managed Care – PPO | Admitting: Family Medicine

## 2020-05-11 ENCOUNTER — Other Ambulatory Visit: Payer: Self-pay

## 2020-05-11 ENCOUNTER — Encounter: Payer: Self-pay | Admitting: Family Medicine

## 2020-05-11 VITALS — BP 118/84 | HR 72 | Ht 65.0 in | Wt 191.0 lb

## 2020-05-11 DIAGNOSIS — M75112 Incomplete rotator cuff tear or rupture of left shoulder, not specified as traumatic: Secondary | ICD-10-CM

## 2020-05-11 DIAGNOSIS — G8929 Other chronic pain: Secondary | ICD-10-CM

## 2020-05-11 DIAGNOSIS — M25512 Pain in left shoulder: Secondary | ICD-10-CM | POA: Diagnosis not present

## 2020-05-11 MED ORDER — VITAMIN D (ERGOCALCIFEROL) 1.25 MG (50000 UNIT) PO CAPS: 50000.0000 [IU] | ORAL_CAPSULE | ORAL | 0 refills | Status: DC

## 2020-05-11 NOTE — Patient Instructions (Signed)
Vit D Duexis 1 pill, 3x a day for 3 days 2 days off from exercises then restart Ice 3x a day for next 2 days for 20 minutes each time If not better in 2 weeks, call and we will get MRA Otherwise see me in 6 weeks
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
## 2020-05-11 NOTE — Assessment & Plan Note (Signed)
Patient continues to have some mild weakness and I believe that patient has a recurrent injury to the rotator cuff.  Unable to see it on exam today on the ultrasound the patient does have a glenohumeral joint effusion.  Discussed with patient about the MR arthrogram being a good idea at this time but patient declined.  Patient will do Duexis, icing regimen, home exercises follow-up again 6 weeks otherwise but if pain worsens she can call and we will do MR arthrogram.

## 2020-05-19 ENCOUNTER — Telehealth: Payer: Self-pay | Admitting: Internal Medicine

## 2020-05-19 NOTE — Telephone Encounter (Signed)
New message:   Pt is calling in with new onset abdominal pain. I have the pt scheduled for tomorrow at 2:40 and advised the pt I would get [facility] on the line. The pt hung up the phone while I was calling [facility].

## 2020-05-20 ENCOUNTER — Encounter: Payer: Self-pay | Admitting: Internal Medicine

## 2020-05-20 ENCOUNTER — Ambulatory Visit: Payer: Medicare PPO | Admitting: Internal Medicine

## 2020-05-20 ENCOUNTER — Ambulatory Visit (INDEPENDENT_AMBULATORY_CARE_PROVIDER_SITE_OTHER): Payer: Medicare PPO

## 2020-05-20 ENCOUNTER — Other Ambulatory Visit: Payer: Self-pay

## 2020-05-20 VITALS — BP 126/60 | HR 73 | Temp 98.3°F | Resp 16 | Ht 65.0 in | Wt 191.0 lb

## 2020-05-20 DIAGNOSIS — R1012 Left upper quadrant pain: Secondary | ICD-10-CM | POA: Diagnosis not present

## 2020-05-20 DIAGNOSIS — E039 Hypothyroidism, unspecified: Secondary | ICD-10-CM

## 2020-05-20 DIAGNOSIS — R1013 Epigastric pain: Secondary | ICD-10-CM

## 2020-05-20 DIAGNOSIS — T50905A Adverse effect of unspecified drugs, medicaments and biological substances, initial encounter: Secondary | ICD-10-CM | POA: Diagnosis not present

## 2020-05-20 NOTE — Progress Notes (Signed)
Subjective:  Patient ID: Toni Ramirez, female    DOB: 1956-03-19  Age: 64 y.o. MRN: [id number]  CC: Abdominal Pain and Hypothyroidism  This visit occurred during the SARS-CoV-2 public health emergency.  Safety protocols were in place, including screening questions prior to the visit, additional usage of staff PPE, and extensive cleaning of exam room while observing appropriate contact time as indicated for disinfecting solutions.    HPI Toni Ramirez presents for concerns about LUQ abd pain.  She [place] the beach ago sitting in a rocking chair when she developed the acute onset of stabbing pain in her left upper quadrant of her abdomen.  The pain has subsequently subsided.  She has not taken anything for the discomfort and she does not want anything for the pain.  She says the pain is positional but not related to intake of food, deep breath, or activity level.  She denies cough, hemoptysis, loss of appetite, nausea, vomiting, dysuria, hematuria, rash, lymphadenopathy, diarrhea, constipation, or melena.  She has been taking a high dose of ibuprofen at the direction of a sports medicine doctor.  Outpatient Medications Prior to Visit  Medication Sig Dispense Refill  . Diclofenac Sodium 2 % SOLN Place 2 g onto the skin 2 (two) times daily. 112 g 3  . SYNTHROID 112 MCG tablet Take 1 tablet (112 mcg total) by mouth daily. 90 tablet 1  . Vitamin D, Ergocalciferol, (DRISDOL) 1.25 MG (50000 UNIT) CAPS capsule Take 1 capsule (50,000 Units total) by mouth every 7 (seven) days. 12 capsule 0  . Vitamin D, Ergocalciferol, (DRISDOL) 1.25 MG (50000 UNIT) CAPS capsule Take 1 capsule (50,000 Units total) by mouth every 7 (seven) days. 12 capsule 0   No facility-administered medications prior to visit.    ROS Review of Systems  Constitutional: Negative.  Negative for appetite change, chills, diaphoresis, fatigue and fever.  HENT: Negative.  Negative for trouble swallowing.   Eyes: Negative.    Respiratory: Negative for cough, chest tightness, shortness of breath and wheezing.   Cardiovascular: Negative for chest pain, palpitations and leg swelling.  Gastrointestinal: Positive for abdominal pain. Negative for constipation, diarrhea, nausea and vomiting.  Endocrine: Negative.   Genitourinary: Negative.  Negative for decreased urine volume, difficulty urinating, dysuria, hematuria and urgency.  Musculoskeletal: Negative.  Negative for back pain and myalgias.  Skin: Negative.  Negative for color change, pallor and rash.  Neurological: Negative.  Negative for dizziness, weakness, light-headedness and headaches.  Hematological: Negative for adenopathy. Does not bruise/bleed easily.    Objective:  BP 126/60 (BP Location: Left Arm, Patient Position: Sitting, Cuff Size: Normal)   Pulse 73   Temp 98.3 F (36.8 C) (Oral)   Resp 16   Ht 5\' 5"  (1.651 m)   Wt 191 lb (86.6 kg)   LMP 06/14/2010   SpO2 96%   BMI 31.78 kg/m   BP Readings from Last 3 Encounters:  05/20/20 126/60  05/11/20 118/84  03/30/20 102/78    Wt Readings from Last 3 Encounters:  05/20/20 191 lb (86.6 kg)  05/11/20 191 lb (86.6 kg)  03/30/20 184 lb (83.5 kg)    Physical Exam Vitals reviewed.  Constitutional:      General: She is not in acute distress.    Appearance: She is well-developed. She is not ill-appearing, toxic-appearing or diaphoretic.  HENT:     Mouth/Throat:     Mouth: Mucous membranes are moist.  Eyes:     General: No scleral icterus.  Conjunctiva/sclera: Conjunctivae normal.  Cardiovascular:     Rate and Rhythm: Normal rate and regular rhythm.     Heart sounds: No murmur heard.   Pulmonary:     Effort: Pulmonary effort is normal.     Breath sounds: No stridor. No wheezing, rhonchi or rales.  Abdominal:     General: Abdomen is flat. Bowel sounds are normal. There is no distension.     Palpations: Abdomen is soft. There is no hepatomegaly, splenomegaly or mass.     Tenderness:  There is no abdominal tenderness.     Hernia: No hernia is present.  Musculoskeletal:        General: Normal range of motion.     Cervical back: Neck supple.     Right lower leg: No edema.     Left lower leg: No edema.  Lymphadenopathy:     Cervical: No cervical adenopathy.  Skin:    General: Skin is warm and dry.     Coloration: Skin is not pale.  Neurological:     General: No focal deficit present.     Mental Status: She is alert.  Psychiatric:        Mood and Affect: Mood normal.        Behavior: Behavior normal.     Lab Results  Component Value Date   WBC 8.6 05/20/2020   HGB 14.4 05/20/2020   HCT 43.4 05/20/2020   PLT 357 05/20/2020   GLUCOSE 87 05/20/2020   CHOL 234 (H) 08/19/2019   TRIG 134.0 08/19/2019   HDL 47.50 08/19/2019   LDLCALC 159 (H) 08/19/2019   ALT 11 05/20/2020   AST 13 05/20/2020   NA 140 05/20/2020   K 4.5 05/20/2020   CL 102 05/20/2020   CREATININE 0.95 05/20/2020   BUN 18 05/20/2020   CO2 26 05/20/2020   TSH 0.94 05/20/2020   HGBA1C 5.8 08/19/2019    NM Whole Body I131 Scan W/Thyrogen  Result Date: 06/23/2017 CLINICAL DATA:  Thyroid cancer post thyroidectomy and postoperative radioactive iodine ablation EXAM: THYROGEN-STIMULATED I-131 WHOLE BODY SCAN TECHNIQUE: The patient received 0.9 mg Thyrogen intramuscularly every 24 hours for two doses. On the third day the patient returned and received the radiopharmaceutical, per orally. On the fifth day, the patient returned and whole body planar images were obtained in the anterior and posterior projections. RADIOPHARMACEUTICALS:  4.3 mCi I-131 sodium iodide orally COMPARISON:  08/05/2016 ; correlation CT chest 09/05/2016 FINDINGS: No abnormal tracer uptake in the cervical region. Focus of tracer accumulation at lower LEFT chest/LEFT upper quadrant again seen, unchanged. No evidence of tumor recurrence was seen at this site on the interval CT chest exam; this most likely represents tracer accumulation  within the gastric fundus rather than the inferior LEFT lung. Excreted tracer within colon and urinary bladder. No additional sites of abnormal radio iodine accumulation are identified to suggest iodine-avid metastatic thyroid cancer. IMPRESSION: No definite scintigraphic evidence of iodine-avid metastatic thyroid cancer. Electronically Signed   By: Lavonia Dana M.D.   On: 06/23/2017 12:19   DG ABD ACUTE 2+V W 1V CHEST  Result Date: 05/20/2020 CLINICAL DATA:  One-week history of LEFT UPPER QUADRANT abdominal pain. EXAM: DG ABDOMEN ACUTE W/ 1V CHEST COMPARISON:  No prior abdominal imaging.  Chest x-ray 11/20/2013. FINDINGS: Bowel gas pattern unremarkable without evidence of obstruction or significant ileus. No evidence of free air or significant air-fluid levels on the erect image. Expected stool burden in the colon. No visible opaque urinary tract calculi. Degenerative  changes involving the thoracic and lumbar spine. Cardiomediastinal silhouette unremarkable and unchanged. Lungs clear. Bronchovascular markings normal. Pulmonary vascularity normal. No visible pleural effusions. No pneumothorax. IMPRESSION: 1. No acute abdominal abnormality. 2. No acute cardiopulmonary disease. Electronically Signed   By: Evangeline Dakin M.D.   On: 05/20/2020 16:29    Assessment & Plan:   Carrianne was seen today for abdominal pain and hypothyroidism.  Diagnoses and all orders for this visit:  Left upper quadrant abdominal pain- She has had a 1 week history of left upper quadrant abdominal pain that is subsiding.  The interesting piece of the history is that she was taking ibuprofen.  Her exam, labs, and x-ray are reassuring.  I am concerned she may be developing some upper GI illness related to the NSAID use so of asked her to start taking a PPI.  She will let me know if the symptoms do not resolve and if she develops any new or worsening symptoms. She has stopped taking ibuprofen. -     CBC with Differential/Platelet;  Future -     Basic metabolic panel; Future -     Amylase; Future -     Lipase; Future -     Urinalysis, Routine w reflex microscopic; Future -     Hepatic function panel; Future -     DG ABD ACUTE 2+V W 1V CHEST; Future -     Urinalysis, Routine w reflex microscopic -     Lipase -     Amylase -     Basic metabolic panel -     CBC with Differential/Platelet -     Hepatic function panel  Acquired hypothyroidism- Her TSH is in the normal range.  She will stay on the current dose of levothyroxine. -     TSH; Future -     TSH  Drug-induced dyspepsia -     esomeprazole (NEXIUM) 40 MG capsule; Take 1 capsule (40 mg total) by mouth daily.  Other orders -     MICROSCOPIC MESSAGE   I am having Octavio Manns "Lisette Grinder" start on esomeprazole. I am also having her maintain her Synthroid, Vitamin D (Ergocalciferol), Diclofenac Sodium, and Vitamin D (Ergocalciferol).  Meds ordered this encounter  Medications  . esomeprazole (NEXIUM) 40 MG capsule    Sig: Take 1 capsule (40 mg total) by mouth daily.    Dispense:  30 capsule    Refill:  3   I spent 60 minutes in preparing to see the patient by review of recent labs, imaging and procedures, obtaining and reviewing separately obtained history, communicating with the patient and family or caregiver, ordering medications, tests or procedures, and documenting clinical information in the EHR including the differential Dx, treatment, and any further evaluation and other management of 1. Left upper quadrant abdominal pain 2. Acquired hypothyroidism 3. Drug-induced dyspepsia     Follow-up: Return in about 3 weeks (around 06/10/2020).  Scarlette Calico, MD

## 2020-05-20 NOTE — Patient Instructions (Signed)
Abdominal Pain, Adult Pain in the abdomen (abdominal pain) can be caused by many things. Often, abdominal pain is not serious and it gets better with no treatment or by being treated at home. However, sometimes abdominal pain is serious. Your health care provider will ask questions about your medical history and do a physical exam to try to determine the cause of your abdominal pain. Follow these instructions at home:  Medicines  Take over-the-counter and prescription medicines only as told by your health care provider.  Do not take a laxative unless told by your health care provider. General instructions  Watch your condition for any changes.  Drink enough fluid to keep your urine pale yellow.  Keep all follow-up visits as told by your health care provider. This is important. Contact a health care provider if:  Your abdominal pain changes or gets worse.  You are not hungry or you lose weight without trying.  You are constipated or have diarrhea for more than 2-3 days.  You have pain when you urinate or have a bowel movement.  Your abdominal pain wakes you up at night.  Your pain gets worse with meals, after eating, or with certain foods.  You are vomiting and cannot keep anything down.  You have a fever.  You have blood in your urine. Get help right away if:  Your pain does not go away as soon as your health care provider told you to expect.  You cannot stop vomiting.  Your pain is only in areas of the abdomen, such as the right side or the left lower portion of the abdomen. Pain on the right side could be caused by appendicitis.  You have bloody or black stools, or stools that look like tar.  You have severe pain, cramping, or bloating in your abdomen.  You have signs of dehydration, such as: ? Dark urine, very little urine, or no urine. ? Cracked lips. ? Dry mouth. ? Sunken eyes. ? Sleepiness. ? Weakness.  You have trouble breathing or chest  pain. Summary  Often, abdominal pain is not serious and it gets better with no treatment or by being treated at home. However, sometimes abdominal pain is serious.  Watch your condition for any changes.  Take over-the-counter and prescription medicines only as told by your health care provider.  Contact a health care provider if your abdominal pain changes or gets worse.  Get help right away if you have severe pain, cramping, or bloating in your abdomen. This information is not intended to replace advice given to you by your health care provider. Make sure you discuss any questions you have with your health care provider. Document Revised: 03/11/2019 Document Reviewed: 03/11/2019 Elsevier Patient Education  2020 [employer]  

## 2020-05-21 LAB — URINALYSIS, ROUTINE W REFLEX MICROSCOPIC
Bacteria, UA: NONE SEEN /HPF
Bilirubin Urine: NEGATIVE
Glucose, UA: NEGATIVE
Hgb urine dipstick: NEGATIVE
Hyaline Cast: NONE SEEN /LPF
Ketones, ur: NEGATIVE
Nitrite: NEGATIVE
Protein, ur: NEGATIVE
RBC / HPF: NONE SEEN /HPF (ref 0–2)
Specific Gravity, Urine: 1.013 (ref 1.001–1.03)
Squamous Epithelial / HPF: NONE SEEN /HPF (ref <=5)
pH: 8 (ref 5.0–8.0)

## 2020-05-21 LAB — BASIC METABOLIC PANEL
BUN: 18 mg/dL (ref 7–25)
CO2: 26 mmol/L (ref 20–32)
Calcium: 9.9 mg/dL (ref 8.6–10.4)
Chloride: 102 mmol/L (ref 98–110)
Creat: 0.95 mg/dL (ref 0.50–0.99)
Glucose, Bld: 87 mg/dL (ref 65–99)
Potassium: 4.5 mmol/L (ref 3.5–5.3)
Sodium: 140 mmol/L (ref 135–146)

## 2020-05-21 LAB — CBC WITH DIFFERENTIAL/PLATELET
Absolute Monocytes: 602 cells/uL (ref 200–950)
Basophils Absolute: 43 cells/uL (ref 0–200)
Basophils Relative: 0.5 %
Eosinophils Absolute: 129 cells/uL (ref 15–500)
Eosinophils Relative: 1.5 %
HCT: 43.4 % (ref 35.0–45.0)
Hemoglobin: 14.4 g/dL (ref 11.7–15.5)
Lymphs Abs: 3268 cells/uL (ref 850–3900)
MCH: 29.7 pg (ref 27.0–33.0)
MCHC: 33.2 g/dL (ref 32.0–36.0)
MCV: 89.5 fL (ref 80.0–100.0)
MPV: 8.9 fL (ref 7.5–12.5)
Monocytes Relative: 7 %
Neutro Abs: 4558 cells/uL (ref 1500–7800)
Neutrophils Relative %: 53 %
Platelets: 357 10*3/uL (ref 140–400)
RBC: 4.85 10*6/uL (ref 3.80–5.10)
RDW: 12.8 % (ref 11.0–15.0)
Total Lymphocyte: 38 %
WBC: 8.6 10*3/uL (ref 3.8–10.8)

## 2020-05-21 LAB — HEPATIC FUNCTION PANEL
AG Ratio: 1.8 (calc) (ref 1.0–2.5)
ALT: 11 U/L (ref 6–29)
AST: 13 U/L (ref 10–35)
Albumin: 4.4 g/dL (ref 3.6–5.1)
Alkaline phosphatase (APISO): 71 U/L (ref 37–153)
Bilirubin, Direct: 0.1 mg/dL (ref 0.0–0.2)
Globulin: 2.5 g/dL (calc) (ref 1.9–3.7)
Indirect Bilirubin: 0.9 mg/dL (calc) (ref 0.2–1.2)
Total Bilirubin: 1 mg/dL (ref 0.2–1.2)
Total Protein: 6.9 g/dL (ref 6.1–8.1)

## 2020-05-21 LAB — AMYLASE: Amylase: 48 U/L (ref 21–101)

## 2020-05-21 LAB — LIPASE: Lipase: 19 U/L (ref 7–60)

## 2020-05-21 LAB — TSH: TSH: 0.94 mIU/L (ref 0.40–4.50)

## 2020-05-24 DIAGNOSIS — T50905A Adverse effect of unspecified drugs, medicaments and biological substances, initial encounter: Secondary | ICD-10-CM | POA: Insufficient documentation

## 2020-05-24 MED ORDER — ESOMEPRAZOLE MAGNESIUM 40 MG PO CPDR: 40.0000 mg | DELAYED_RELEASE_CAPSULE | Freq: Every day | ORAL | 3 refills | Status: DC

## 2020-06-30 ENCOUNTER — Ambulatory Visit: Payer: BC Managed Care – PPO | Admitting: Family Medicine

## 2020-07-27 DIAGNOSIS — Z20828 Contact with and (suspected) exposure to other viral communicable diseases: Secondary | ICD-10-CM | POA: Diagnosis not present

## 2020-08-06 ENCOUNTER — Ambulatory Visit: Payer: Medicare PPO | Admitting: Family Medicine

## 2021-02-09 DIAGNOSIS — C73 Malignant neoplasm of thyroid gland: Secondary | ICD-10-CM | POA: Diagnosis not present

## 2021-02-09 DIAGNOSIS — E890 Postprocedural hypothyroidism: Secondary | ICD-10-CM | POA: Diagnosis not present

## 2021-02-16 DIAGNOSIS — C73 Malignant neoplasm of thyroid gland: Secondary | ICD-10-CM | POA: Diagnosis not present

## 2021-02-16 DIAGNOSIS — E890 Postprocedural hypothyroidism: Secondary | ICD-10-CM | POA: Diagnosis not present

## 2021-04-08 ENCOUNTER — Encounter: Payer: Self-pay | Admitting: Internal Medicine

## 2021-04-08 ENCOUNTER — Ambulatory Visit: Payer: Medicare PPO | Admitting: Internal Medicine

## 2021-04-08 ENCOUNTER — Other Ambulatory Visit: Payer: Self-pay

## 2021-04-08 DIAGNOSIS — M545 Low back pain, unspecified: Secondary | ICD-10-CM

## 2021-04-08 DIAGNOSIS — R739 Hyperglycemia, unspecified: Secondary | ICD-10-CM | POA: Diagnosis not present

## 2021-04-08 DIAGNOSIS — F419 Anxiety disorder, unspecified: Secondary | ICD-10-CM

## 2021-04-08 MED ORDER — CYCLOBENZAPRINE HCL 5 MG PO TABS
5.0000 mg | ORAL_TABLET | Freq: Three times a day (TID) | ORAL | 1 refills | Status: DC | PRN
Start: 2021-04-08 — End: 2024-01-17

## 2021-04-08 NOTE — Progress Notes (Signed)
Patient ID: Toni Ramirez, female   DOB: [birth year], 65 y.o.   MRN: [id number]        Chief Complaint:  Back pain       HPI:  Toni Ramirez is a 65 y.o. female here with c/o 1 wk onset diffuse lower back pain, sharp and dull, mild to mod, worse to stand up and bending at the waist, better to sit or lie down, and Pt denies bowel or bladder change, fever, wt loss,  worsening LE pain/numbness/weakness, gait change or falls.  Did have 2 recent deaths in the family, pain seems worse when thinking about that. Denies worsening depressive symptoms, suicidal ideation, or panic.  Has not tried any OTC meds.  Pt denies chest pain, increased sob or doe, wheezing, orthopnea, PND, increased LE swelling, palpitations, dizziness or syncope.   Pt denies polydipsia, polyuria, or new focal neuro s/s.         Wt Readings from Last 3 Encounters:  04/08/21 173 lb (78.5 kg)  05/20/20 191 lb (86.6 kg)  05/11/20 191 lb (86.6 kg)   BP Readings from Last 3 Encounters:  04/08/21 116/66  05/20/20 126/60  05/11/20 118/84         Past Medical History:  Diagnosis Date  . Anginal pain ([place])    "because of anxiety"- none significant episode  . Anxiety   . Arthritis   . Cancer [facility])    thyroid cancer dx. 12-19-13,further surgery planned   Past Surgical History:  Procedure Laterality Date  . [place], 2003  . THYROID LOBECTOMY Right 12/20/2013   Procedure: RIGHT THYROID LOBECTOMY;  Surgeon: Shann Medal, MD;  Location: [facility];  Service: General;  Laterality: Right;  . THYROIDECTOMY N/A 12/25/2013   Procedure: COMPLETION THYROIDECTOMY;  Surgeon: Shann Medal, MD;  Location: [facility];  Service: General;  Laterality: N/A;  . TONSILLECTOMY      reports that she has never smoked. She has never used smokeless tobacco. She reports current alcohol use. She reports that she does not use drugs. family history includes Breast cancer in her mother; Cancer in her mother. No Known Allergies Current  Outpatient Medications on File Prior to Visit  Medication Sig Dispense Refill  . SYNTHROID 112 MCG tablet Take 1 tablet (112 mcg total) by mouth daily. 90 tablet 1  . Diclofenac Sodium 2 % SOLN Place 2 g onto the skin 2 (two) times daily. (Patient not taking: Reported on 04/08/2021) 112 g 3  . Emollient (COLLAGEN) CREA See admin instructions. (Patient not taking: Reported on 04/08/2021)    . esomeprazole (NEXIUM) 40 MG capsule Take 1 capsule (40 mg total) by mouth daily. (Patient not taking: Reported on 04/08/2021) 30 capsule 3  . Vitamin D, Ergocalciferol, (DRISDOL) 1.25 MG (50000 UNIT) CAPS capsule Take 1 capsule (50,000 Units total) by mouth every 7 (seven) days. (Patient not taking: Reported on 04/08/2021) 12 capsule 0  . Vitamin D, Ergocalciferol, (DRISDOL) 1.25 MG (50000 UNIT) CAPS capsule Take 1 capsule (50,000 Units total) by mouth every 7 (seven) days. (Patient not taking: Reported on 04/08/2021) 12 capsule 0   No current facility-administered medications on file prior to visit.        ROS:  All others reviewed and negative.  Objective        PE:  BP 116/66 (BP Location: Right Arm, Patient Position: Sitting, Cuff Size: Large)   Pulse (!) 58   Temp 98.1 F (36.7 C) (Oral)  Ht 5\' 5"  (1.651 m)   Wt 173 lb (78.5 kg)   LMP 06/14/2010   SpO2 98%   BMI 28.79 kg/m                 Constitutional: Pt appears in NAD               HENT: Head: NCAT.                Right Ear: External ear normal.                 Left Ear: External ear normal.                Eyes: . Pupils are equal, round, and reactive to light. Conjunctivae and EOM are normal               Nose: without d/c or deformity               Neck: Neck supple. Gross normal ROM               Cardiovascular: Normal rate and regular rhythm.                 Pulmonary/Chest: Effort normal and breath sounds without rales or wheezing.                Abd:  Soft, NT, ND, + BS, no organomega               Spine nontender in midline but has  bilateral paravertebral tender spasm               Neurological: Pt is alert. At baseline orientation, motor grossly intact               Skin: Skin is warm. No rashes, no other new lesions, LE edema - none               Psychiatric: Pt behavior is normal without agitation , 1+ nervous  Micro: none  Cardiac tracings I have personally interpreted today:  none  Pertinent Radiological findings (summarize): none   Lab Results  Component Value Date   WBC 8.6 05/20/2020   HGB 14.4 05/20/2020   HCT 43.4 05/20/2020   PLT 357 05/20/2020   GLUCOSE 87 05/20/2020   CHOL 234 (H) 08/19/2019   TRIG 134.0 08/19/2019   HDL 47.50 08/19/2019   LDLCALC 159 (H) 08/19/2019   ALT 11 05/20/2020   AST 13 05/20/2020   NA 140 05/20/2020   K 4.5 05/20/2020   CL 102 05/20/2020   CREATININE 0.95 05/20/2020   BUN 18 05/20/2020   CO2 26 05/20/2020   TSH 0.94 05/20/2020   HGBA1C 5.8 08/19/2019   Assessment/Plan:  Toni Ramirez is a 65 y.o. White or Caucasian [1] female with  has a past medical history of Anginal pain ([place]), Anxiety, Arthritis, and Cancer ([place]).  Low back pain D/w pt, c/w msk strain and cant r/o underlying lumbar djd or ddd pain flare, ok for flexeril prn, and salonpaz otc prn,  to f/u any worsening symptoms or concerns  Hyperglycemia Lab Results  Component Value Date   HGBA1C 5.8 08/19/2019   Stable, pt to continue current medical treatment  - diet   Anxiety Mild but not debilitating, reassured, declines specific tx for now  Followup: Return if symptoms worsen or fail to improve.  Cathlean Cower, MD 04/11/2021 4:44 PM [facility]

## 2021-04-08 NOTE — Patient Instructions (Signed)
Please take all new medication as prescribed - the flexeril as needed  [place] also to try the topical patch OTC such as Salonpas (even for your neck if needed)  Please continue all other medications as before, and refills have been done if requested.  Please have the pharmacy call with any other refills you may need.  Please keep your appointments with your specialists as you may have planned

## 2021-04-11 ENCOUNTER — Encounter: Payer: Self-pay | Admitting: Internal Medicine

## 2021-04-11 DIAGNOSIS — F419 Anxiety disorder, unspecified: Secondary | ICD-10-CM | POA: Insufficient documentation

## 2021-04-11 DIAGNOSIS — M545 Low back pain, unspecified: Secondary | ICD-10-CM | POA: Insufficient documentation

## 2021-04-11 NOTE — Assessment & Plan Note (Signed)
Lab Results  Component Value Date   HGBA1C 5.8 08/19/2019   Stable, pt to continue current medical treatment  - diet

## 2021-04-11 NOTE — Assessment & Plan Note (Signed)
D/w pt, c/w msk strain and cant r/o underlying lumbar djd or ddd pain flare, ok for flexeril prn, and salonpaz otc prn,  to f/u any worsening symptoms or concerns

## 2021-04-11 NOTE — Assessment & Plan Note (Signed)
Mild but not debilitating, reassured, declines specific tx for now

## 2021-05-06 ENCOUNTER — Ambulatory Visit: Payer: Medicare PPO | Admitting: Family Medicine

## 2021-05-07 ENCOUNTER — Other Ambulatory Visit: Payer: Self-pay

## 2021-05-07 ENCOUNTER — Ambulatory Visit: Payer: Medicare PPO | Admitting: Family Medicine

## 2021-05-07 ENCOUNTER — Encounter: Payer: Self-pay | Admitting: Family Medicine

## 2021-05-07 VITALS — BP 120/60 | HR 63 | Temp 98.1°F | Wt 172.5 lb

## 2021-05-07 DIAGNOSIS — L509 Urticaria, unspecified: Secondary | ICD-10-CM

## 2021-05-07 DIAGNOSIS — W57XXXA Bitten or stung by nonvenomous insect and other nonvenomous arthropods, initial encounter: Secondary | ICD-10-CM | POA: Diagnosis not present

## 2021-05-07 DIAGNOSIS — S40862A Insect bite (nonvenomous) of left upper arm, initial encounter: Secondary | ICD-10-CM | POA: Diagnosis not present

## 2021-05-07 DIAGNOSIS — S40861A Insect bite (nonvenomous) of right upper arm, initial encounter: Secondary | ICD-10-CM

## 2021-05-07 NOTE — Progress Notes (Addendum)
Established Patient Office Visit  Subjective:  Patient ID: Toni Ramirez, female    DOB: 12-12-1955  Age: 65 y.o. MRN: [id number]  CC:  Chief Complaint  Patient presents with   Tick Removal    Tick bite x 1 week, 2 ticks, had a rash across the stomach yesterday but is gone now, diarrhea x 2 days     HPI Toni Ramirez presents for couple recent tick bites.  She had 1 on Saturday and then again on Wednesday on her upper extremities.  She described this as a [place] tick.  She was concerned because she had what look like urticarial type rash on her abdomen couple days ago but now completely resolved.  She also had some recent GI symptoms with diarrhea which are resolving.  No fever.  No headaches.  No other skin rashes.  Tick bite location: one on right arm and one on left arm.    She took a picture of the abdominal skin rash which looked like raised urticarial type rash.  Not petechial.  No erythema migrans.  Past Medical History:  Diagnosis Date   Anginal pain ([place])    "because of anxiety"- none significant episode   Anxiety    Arthritis    Cancer ([place])    thyroid cancer dx. 12-19-13,further surgery planned    Past Surgical History:  Procedure Laterality Date   DILATION AND CURETTAGE OF UTERUS  1994, 2003   THYROID LOBECTOMY Right 12/20/2013   Procedure: RIGHT THYROID LOBECTOMY;  Surgeon: Shann Medal, MD;  Location: [facility];  Service: General;  Laterality: Right;   THYROIDECTOMY N/A 12/25/2013   Procedure: COMPLETION THYROIDECTOMY;  Surgeon: Shann Medal, MD;  Location: [facility];  Service: General;  Laterality: N/A;   TONSILLECTOMY      Family History  Problem Relation Age of Onset   Breast cancer Mother    Cancer Mother        uterine, breast   Alcohol abuse Neg Hx    Depression Neg Hx    Diabetes Neg Hx    Arthritis Neg Hx    Heart disease Neg Hx    Hyperlipidemia Neg Hx    Hypertension Neg Hx    Kidney disease Neg Hx    Stroke Neg Hx     Social History    Socioeconomic History   Marital status: Widowed    Spouse name: Not on file   Number of children: Not on file   Years of education: Not on file   Highest education level: Not on file  Occupational History   Not on file  Tobacco Use   Smoking status: Never   Smokeless tobacco: Never  Substance and Sexual Activity   Alcohol use: Yes    Comment: social- less 1 per week   Drug use: No   Sexual activity: Not Currently  Other Topics Concern   Not on file  Social History Narrative   Not on file   Social Determinants of Health   Financial Resource Strain: Not on file  Food Insecurity: Not on file  Transportation Needs: Not on file  Physical Activity: Not on file  Stress: Not on file  Social Connections: Not on file  Intimate Partner Violence: Not on file    Outpatient Medications Prior to Visit  Medication Sig Dispense Refill   cyclobenzaprine (FLEXERIL) 5 MG tablet Take 1 tablet (5 mg total) by mouth 3 (three) times daily as needed for muscle spasms. 40 tablet  1   Diclofenac Sodium 2 % SOLN Place 2 g onto the skin 2 (two) times daily. 112 g 3   Emollient (COLLAGEN) CREA See admin instructions.     esomeprazole (NEXIUM) 40 MG capsule Take 1 capsule (40 mg total) by mouth daily. 30 capsule 3   SYNTHROID 112 MCG tablet Take 1 tablet (112 mcg total) by mouth daily. 90 tablet 1   Vitamin D, Ergocalciferol, (DRISDOL) 1.25 MG (50000 UNIT) CAPS capsule Take 1 capsule (50,000 Units total) by mouth every 7 (seven) days. 12 capsule 0   Vitamin D, Ergocalciferol, (DRISDOL) 1.25 MG (50000 UNIT) CAPS capsule Take 1 capsule (50,000 Units total) by mouth every 7 (seven) days. 12 capsule 0   No facility-administered medications prior to visit.    No Known Allergies  ROS Review of Systems  Constitutional:  Negative for chills and fever.  Respiratory:  Negative for shortness of breath.   Gastrointestinal:  Negative for abdominal pain.  Neurological:  Negative for headaches.      Objective:    Physical Exam Vitals reviewed.  Constitutional:      Appearance: Normal appearance.  Cardiovascular:     Rate and Rhythm: Normal rate and regular rhythm.  Pulmonary:     Effort: Pulmonary effort is normal.     Breath sounds: Normal breath sounds.  Neurological:     Mental Status: She is alert.    BP 120/60 (BP Location: Left Arm, Patient Position: Sitting, Cuff Size: Normal)   Pulse 63   Temp 98.1 F (36.7 C) (Oral)   Wt 172 lb 8 oz (78.2 kg)   LMP 06/14/2010   SpO2 98%   BMI 28.71 kg/m  Wt Readings from Last 3 Encounters:  05/07/21 172 lb 8 oz (78.2 kg)  04/08/21 173 lb (78.5 kg)  05/20/20 191 lb (86.6 kg)     Health Maintenance Due  Topic Date Due   Zoster Vaccines- Shingrix (2 of 2) 10/14/2019   COVID-19 Vaccine (3 - Mixed Product risk series) 02/08/2020   DEXA SCAN  Never done   PNA vac Low Risk Adult (1 of 2 - PCV13) Never done   COLONOSCOPY (Pts 45-56yrs Insurance coverage will need to be confirmed)  07/09/2020   PAP SMEAR-Modifier  07/19/2020    There are no preventive care reminders to display for this patient.  Lab Results  Component Value Date   TSH 0.94 05/20/2020   Lab Results  Component Value Date   WBC 8.6 05/20/2020   HGB 14.4 05/20/2020   HCT 43.4 05/20/2020   MCV 89.5 05/20/2020   PLT 357 05/20/2020   Lab Results  Component Value Date   NA 140 05/20/2020   K 4.5 05/20/2020   CO2 26 05/20/2020   GLUCOSE 87 05/20/2020   BUN 18 05/20/2020   CREATININE 0.95 05/20/2020   BILITOT 1.0 05/20/2020   ALKPHOS 65 08/19/2019   AST 13 05/20/2020   ALT 11 05/20/2020   PROT 6.9 05/20/2020   ALBUMIN 4.5 08/19/2019   CALCIUM 9.9 05/20/2020   GFR 68.21 08/19/2019   Lab Results  Component Value Date   CHOL 234 (H) 08/19/2019   Lab Results  Component Value Date   HDL 47.50 08/19/2019   Lab Results  Component Value Date   LDLCALC 159 (H) 08/19/2019   Lab Results  Component Value Date   TRIG 134.0 08/19/2019   Lab  Results  Component Value Date   CHOLHDL 5 08/19/2019   Lab Results  Component Value Date  HGBA1C 5.8 08/19/2019      Assessment & Plan:   #1 recent urticarial type rash abdomen.  This lasted for just a day and was very linear in appearance and look like pressure type urticaria.  Resolved at this time and reassurance given  #2 couple recent tick bites.  Does not have any worrisome symptoms such as fever, headaches, arthralgias -Discussed various types of tick related illness and things to look out for  #3 recent GI symptoms of diarrhea.  Doubt related to #2.  Symptoms resolving at this time -Recommend bland diet until diarrhea fully resolved  No orders of the defined types were placed in this encounter.   Follow-up: No follow-ups on file.    Carolann Littler, MD

## 2021-05-19 DIAGNOSIS — L578 Other skin changes due to chronic exposure to nonionizing radiation: Secondary | ICD-10-CM | POA: Diagnosis not present

## 2021-05-19 DIAGNOSIS — L570 Actinic keratosis: Secondary | ICD-10-CM | POA: Diagnosis not present

## 2021-05-19 DIAGNOSIS — L821 Other seborrheic keratosis: Secondary | ICD-10-CM | POA: Diagnosis not present

## 2021-05-19 DIAGNOSIS — D225 Melanocytic nevi of trunk: Secondary | ICD-10-CM | POA: Diagnosis not present

## 2021-05-19 DIAGNOSIS — L814 Other melanin hyperpigmentation: Secondary | ICD-10-CM | POA: Diagnosis not present

## 2021-07-08 ENCOUNTER — Other Ambulatory Visit: Payer: Self-pay | Admitting: Internal Medicine

## 2021-07-08 ENCOUNTER — Telehealth: Payer: Self-pay | Admitting: Internal Medicine

## 2021-07-08 DIAGNOSIS — J208 Acute bronchitis due to other specified organisms: Secondary | ICD-10-CM | POA: Insufficient documentation

## 2021-07-08 DIAGNOSIS — U071 COVID-19: Secondary | ICD-10-CM | POA: Insufficient documentation

## 2021-07-08 MED ORDER — NIRMATRELVIR/RITONAVIR (PAXLOVID)TABLET: 3.0000 | ORAL_TABLET | Freq: Two times a day (BID) | ORAL | 0 refills | Status: AC

## 2021-07-08 NOTE — Telephone Encounter (Signed)
Tested positive on yesterday, Sx started on Tuesday- headache, congestion, cough, and runny nose. Requesting paxlovid to be ordered.   Please advise.    Preferred Pharmacy:  [facility], [place] [address] Phone:  [phone]  Fax:  [phone]

## 2021-08-10 IMAGING — DX DG SHOULDER 2+V*L*
3 series · 3 of 3 positions shown · non-contrast
Comparison: None.

CLINICAL DATA: Chronic left shoulder pain without recent injury.

EXAM:
LEFT SHOULDER - 2+ VIEW

[shoulder (grashey view) ap]
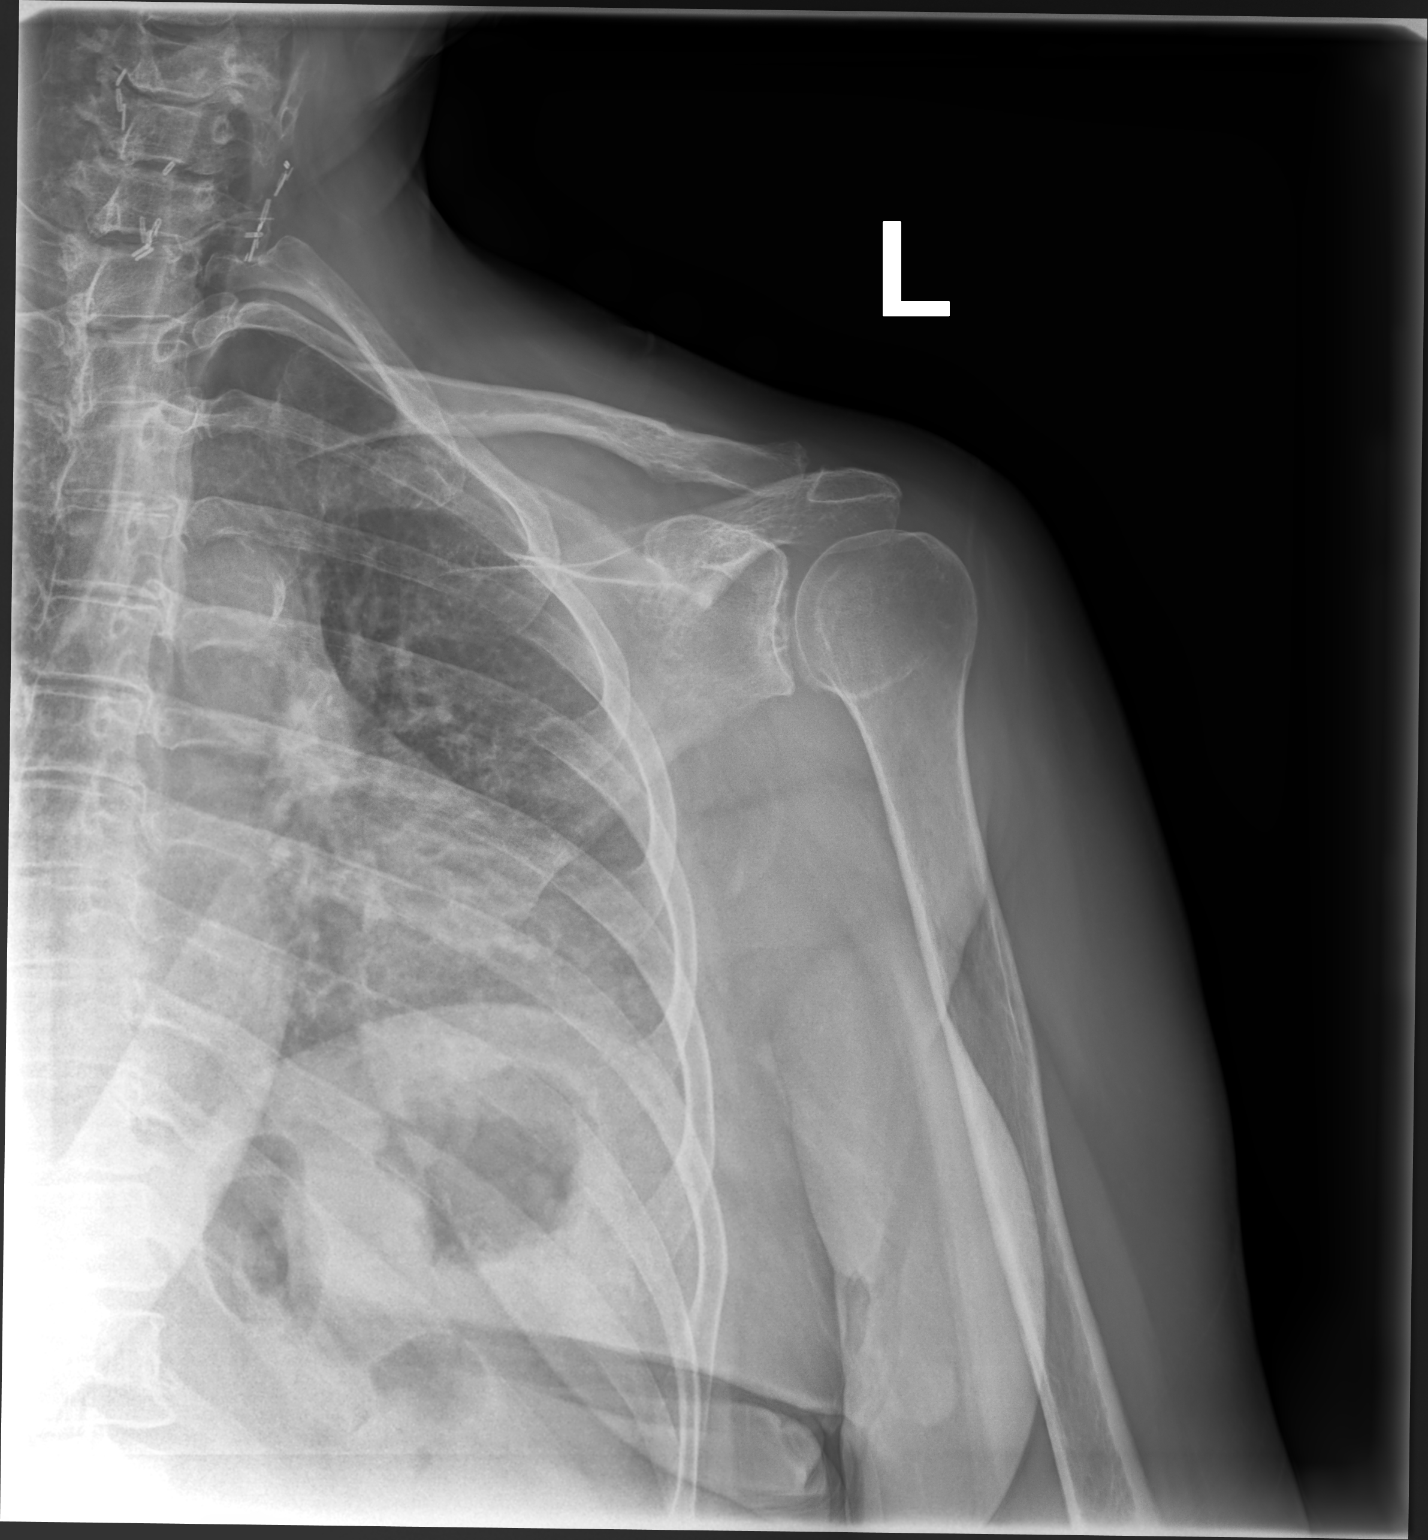

[shoulder (y view) lat]
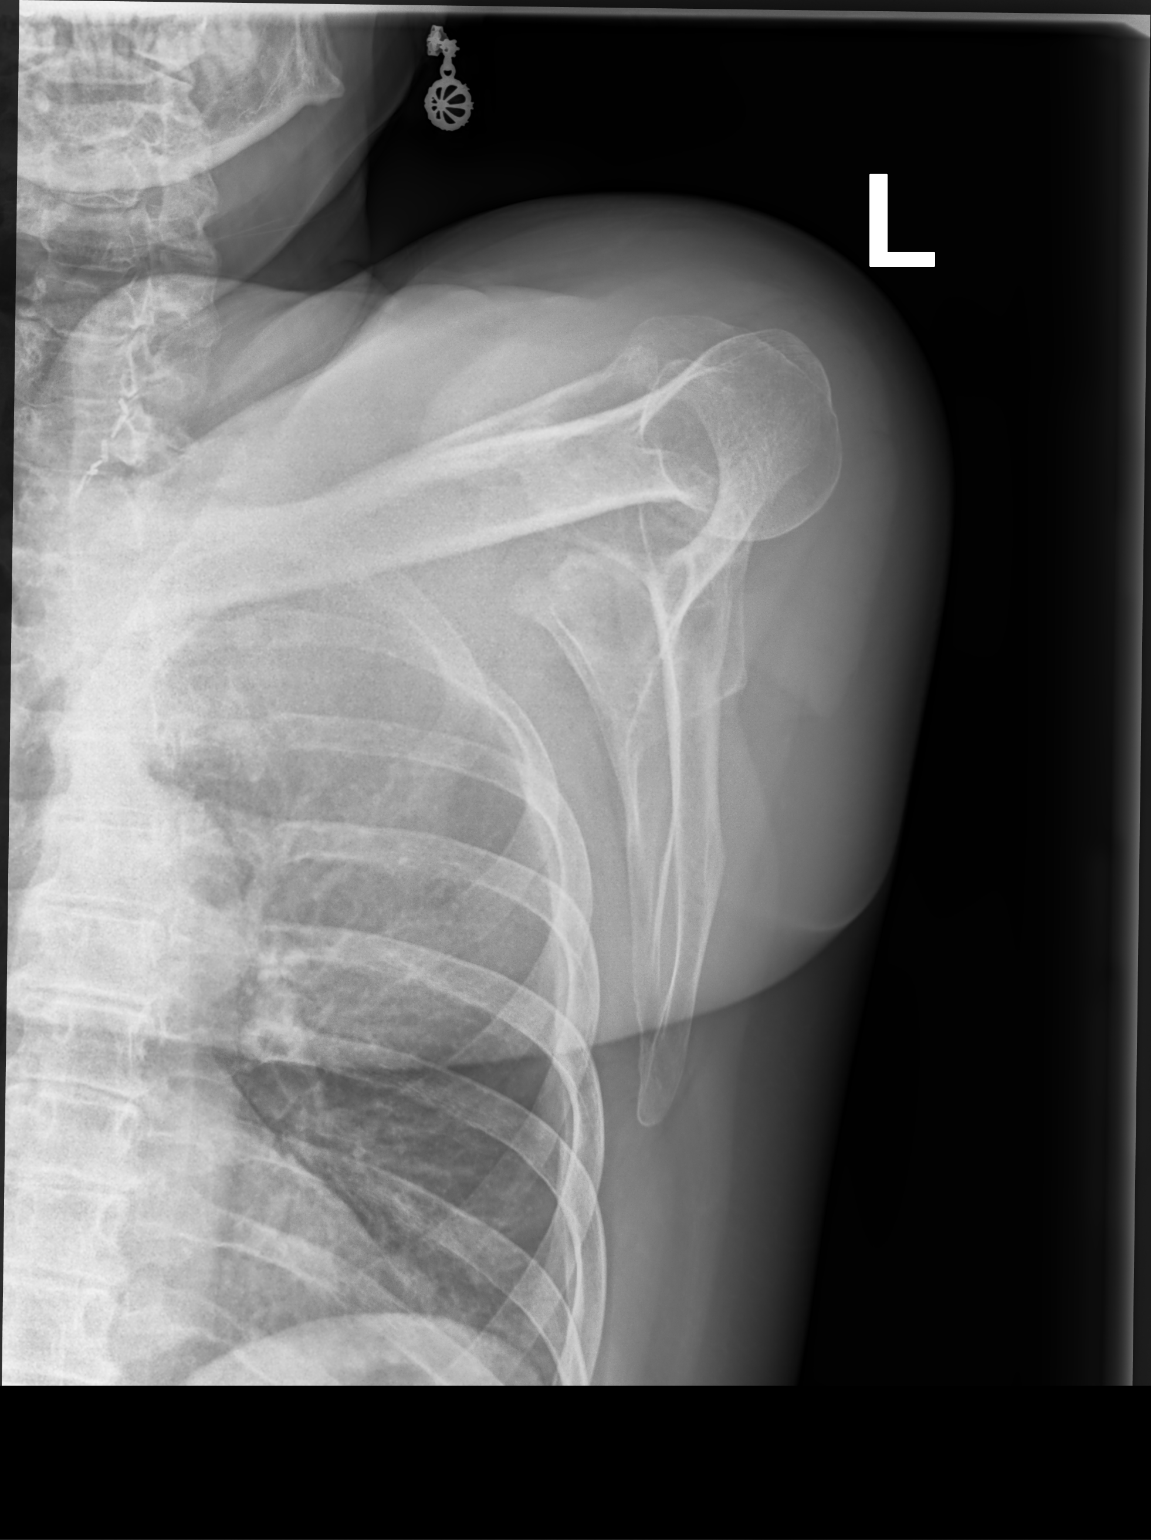

[shoulder axial 45°]
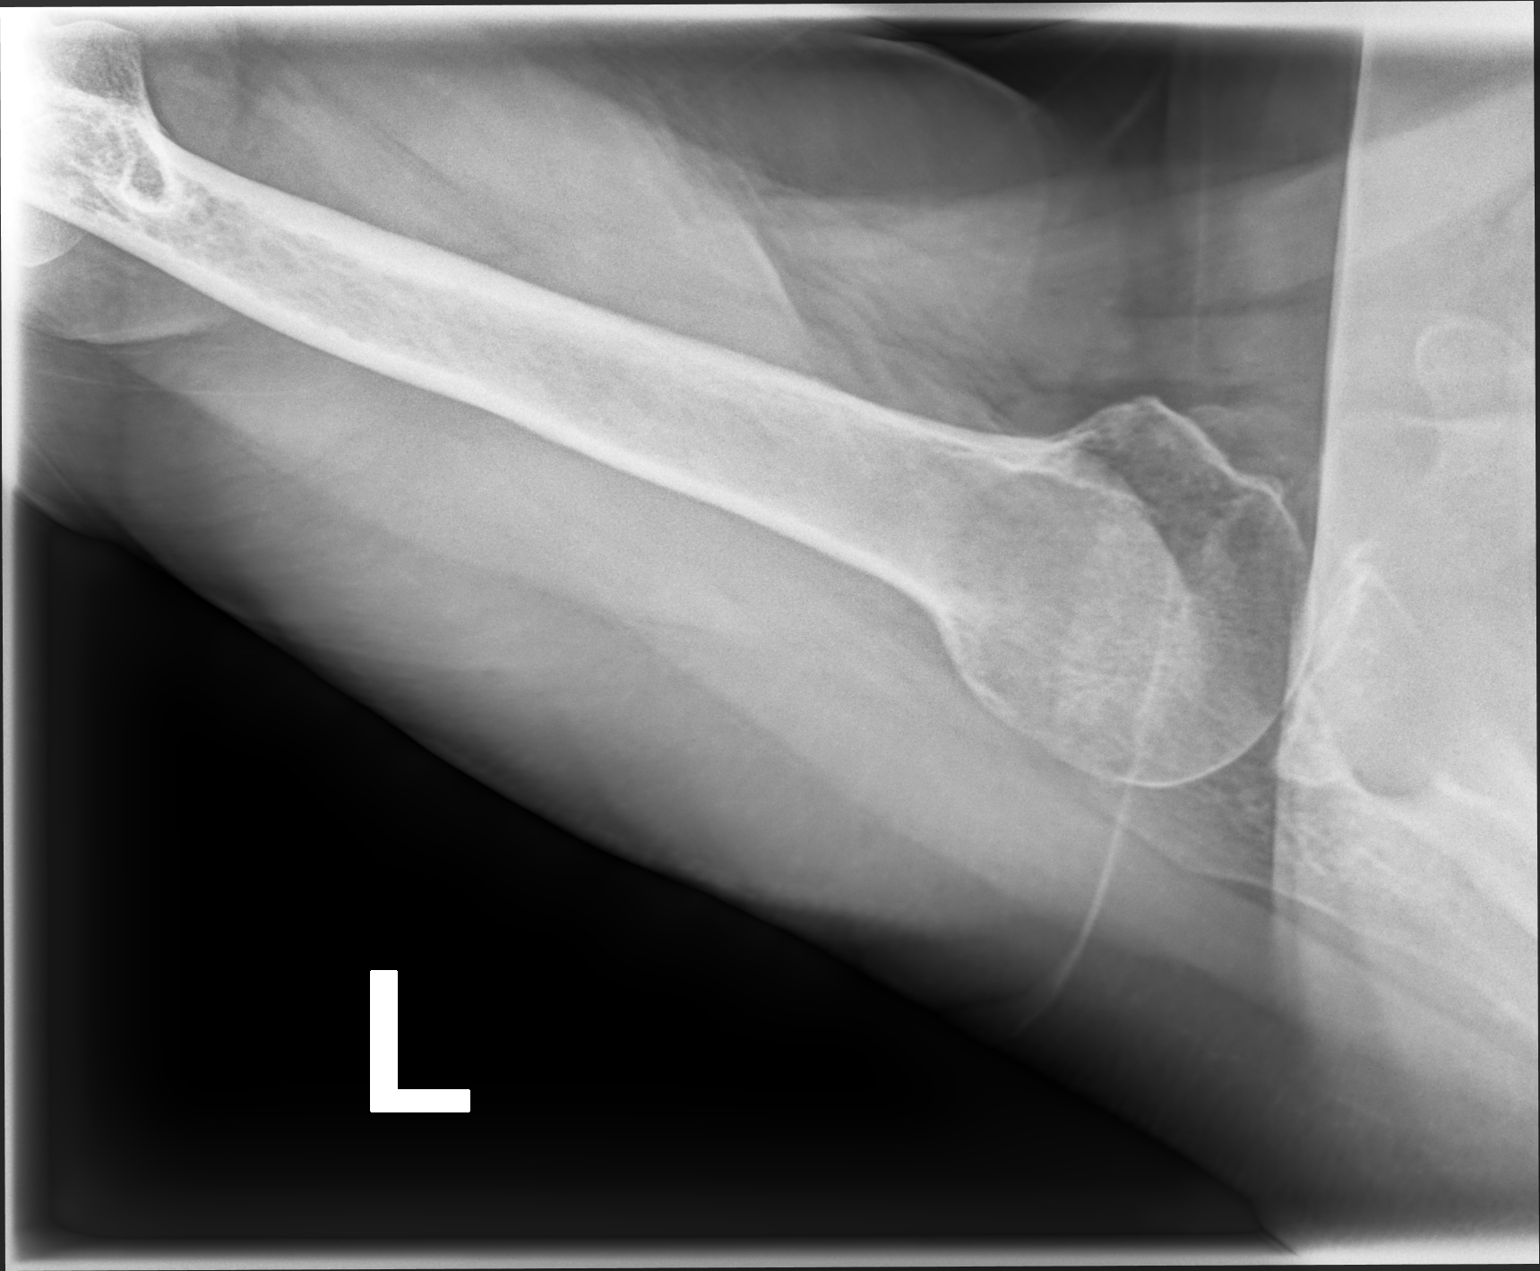

[3 of 3 positions shown; findings below may reference images not displayed]

FINDINGS: There is no evidence of fracture or dislocation. There is no
evidence of arthropathy or other focal bone abnormality. Soft
tissues are unremarkable.
IMPRESSION: Negative.

## 2021-08-25 LAB — HM MAMMOGRAPHY: HM Mammogram: NORMAL (ref 0–4)

## 2021-09-15 DIAGNOSIS — Z01419 Encounter for gynecological examination (general) (routine) without abnormal findings: Secondary | ICD-10-CM | POA: Diagnosis not present

## 2021-09-15 DIAGNOSIS — N952 Postmenopausal atrophic vaginitis: Secondary | ICD-10-CM | POA: Diagnosis not present

## 2021-09-15 DIAGNOSIS — M8580 Other specified disorders of bone density and structure, unspecified site: Secondary | ICD-10-CM | POA: Diagnosis not present

## 2021-09-15 DIAGNOSIS — Z6830 Body mass index (BMI) 30.0-30.9, adult: Secondary | ICD-10-CM | POA: Diagnosis not present

## 2021-09-15 DIAGNOSIS — Z1231 Encounter for screening mammogram for malignant neoplasm of breast: Secondary | ICD-10-CM | POA: Diagnosis not present

## 2021-09-15 DIAGNOSIS — Z124 Encounter for screening for malignant neoplasm of cervix: Secondary | ICD-10-CM | POA: Diagnosis not present

## 2021-09-15 DIAGNOSIS — Z01411 Encounter for gynecological examination (general) (routine) with abnormal findings: Secondary | ICD-10-CM | POA: Diagnosis not present

## 2022-02-17 DIAGNOSIS — C73 Malignant neoplasm of thyroid gland: Secondary | ICD-10-CM | POA: Diagnosis not present

## 2022-02-17 DIAGNOSIS — E890 Postprocedural hypothyroidism: Secondary | ICD-10-CM | POA: Diagnosis not present

## 2022-03-01 ENCOUNTER — Telehealth: Payer: Self-pay

## 2022-03-01 ENCOUNTER — Other Ambulatory Visit: Payer: Self-pay | Admitting: Internal Medicine

## 2022-03-01 DIAGNOSIS — F40243 Fear of flying: Secondary | ICD-10-CM

## 2022-03-01 MED ORDER — ALPRAZOLAM 0.5 MG PO TABS: 0.5000 mg | ORAL_TABLET | Freq: Every evening | ORAL | 1 refills | Status: DC | PRN

## 2022-03-01 NOTE — Telephone Encounter (Signed)
Pt called in requesting a Xanax for anxiety associated with her fear of flying. Pt is scheduled to go on Vacation. ? ?I advised pt that we need to get her scheduled for an appt since its been 5/22 since she was last seen. Pt replied that "I know Dr. Ronnald Ramp personally. He did it in the past can you just ask him?"  She declined the appt and said if he says no then I will call back and make an appt. ? ?Please advise ? ? ?

## 2022-04-28 DIAGNOSIS — E890 Postprocedural hypothyroidism: Secondary | ICD-10-CM | POA: Diagnosis not present

## 2022-04-28 DIAGNOSIS — C73 Malignant neoplasm of thyroid gland: Secondary | ICD-10-CM | POA: Diagnosis not present

## 2022-06-16 ENCOUNTER — Encounter: Payer: Self-pay | Admitting: Internal Medicine

## 2022-06-16 ENCOUNTER — Ambulatory Visit (INDEPENDENT_AMBULATORY_CARE_PROVIDER_SITE_OTHER): Payer: Medicare PPO | Admitting: Internal Medicine

## 2022-06-16 VITALS — BP 118/72 | HR 65 | Temp 98.4°F | Resp 16 | Ht 65.0 in | Wt 182.0 lb

## 2022-06-16 DIAGNOSIS — E785 Hyperlipidemia, unspecified: Secondary | ICD-10-CM | POA: Diagnosis not present

## 2022-06-16 DIAGNOSIS — S91331A Puncture wound without foreign body, right foot, initial encounter: Secondary | ICD-10-CM | POA: Insufficient documentation

## 2022-06-16 DIAGNOSIS — C73 Malignant neoplasm of thyroid gland: Secondary | ICD-10-CM

## 2022-06-16 DIAGNOSIS — Z1231 Encounter for screening mammogram for malignant neoplasm of breast: Secondary | ICD-10-CM | POA: Diagnosis not present

## 2022-06-16 DIAGNOSIS — Z0000 Encounter for general adult medical examination without abnormal findings: Secondary | ICD-10-CM | POA: Diagnosis not present

## 2022-06-16 DIAGNOSIS — Z23 Encounter for immunization: Secondary | ICD-10-CM

## 2022-06-16 DIAGNOSIS — E039 Hypothyroidism, unspecified: Secondary | ICD-10-CM | POA: Diagnosis not present

## 2022-06-16 DIAGNOSIS — M75112 Incomplete rotator cuff tear or rupture of left shoulder, not specified as traumatic: Secondary | ICD-10-CM

## 2022-06-16 HISTORY — DX: Hyperlipidemia, unspecified: E78.5

## 2022-06-16 NOTE — Patient Instructions (Signed)

## 2022-06-16 NOTE — Progress Notes (Signed)
Subjective:  Patient ID: Toni Ramirez, female    DOB: 1956/01/03  Age: 66 y.o. MRN: [id number]  CC: Annual Exam   HPI Toni Ramirez presents for a CPX and f/up -  She stepped on a nail 2 days ago injuring the plantar side of her right foot.  There is some associated pain but no redness, swelling, or discharge.  She saw her endocrinologist about 2 or 3 months ago and her TSH has been adjusted and she has been told that there is no evidence of recurrence of thyroid cancer.  She is active and denies chest pain, shortness of breath, or edema.  Outpatient Medications Prior to Visit  Medication Sig Dispense Refill   ALPRAZolam (XANAX) 0.5 MG tablet Take 1 tablet (0.5 mg total) by mouth at bedtime as needed for anxiety. 30 tablet 1   cyclobenzaprine (FLEXERIL) 5 MG tablet Take 1 tablet (5 mg total) by mouth 3 (three) times daily as needed for muscle spasms. 40 tablet 1   Diclofenac Sodium 2 % SOLN Place 2 g onto the skin 2 (two) times daily. 112 g 3   Emollient (COLLAGEN) CREA See admin instructions.     esomeprazole (NEXIUM) 40 MG capsule Take 1 capsule (40 mg total) by mouth daily. 30 capsule 3   SYNTHROID 112 MCG tablet Take 1 tablet (112 mcg total) by mouth daily. 90 tablet 1   Vitamin D, Ergocalciferol, (DRISDOL) 1.25 MG (50000 UNIT) CAPS capsule Take 1 capsule (50,000 Units total) by mouth every 7 (seven) days. 12 capsule 0   Vitamin D, Ergocalciferol, (DRISDOL) 1.25 MG (50000 UNIT) CAPS capsule Take 1 capsule (50,000 Units total) by mouth every 7 (seven) days. 12 capsule 0   No facility-administered medications prior to visit.    ROS Review of Systems  Constitutional: Negative.  Negative for diaphoresis and fatigue.  HENT: Negative.    Eyes: Negative.   Respiratory:  Negative for cough, chest tightness, shortness of breath and wheezing.   Cardiovascular:  Negative for chest pain, palpitations and leg swelling.  Gastrointestinal:  Negative for abdominal pain, constipation,  diarrhea, nausea and vomiting.  Endocrine: Negative.   Genitourinary: Negative.  Negative for difficulty urinating.  Musculoskeletal: Negative.   Skin:  Positive for wound.  Neurological:  Negative for dizziness and weakness.  Hematological:  Negative for adenopathy. Does not bruise/bleed easily.  Psychiatric/Behavioral: Negative.      Objective:  BP 118/72 (BP Location: Left Arm, Patient Position: Sitting, Cuff Size: Large)   Pulse 65   Temp 98.4 F (36.9 C) (Oral)   Resp 16   Ht '5\' 5"'$  (1.651 m)   Wt 182 lb (82.6 kg)   LMP 06/14/2010   SpO2 96%   BMI 30.29 kg/m   BP Readings from Last 3 Encounters:  06/16/22 118/72  05/07/21 120/60  04/08/21 116/66    Wt Readings from Last 3 Encounters:  06/16/22 182 lb (82.6 kg)  05/07/21 172 lb 8 oz (78.2 kg)  04/08/21 173 lb (78.5 kg)    Physical Exam Vitals reviewed.  HENT:     Nose: Nose normal.     Mouth/Throat:     Mouth: Mucous membranes are moist.  Eyes:     General: No scleral icterus.    Conjunctiva/sclera: Conjunctivae normal.  Neck:     Thyroid: No thyroid mass, thyromegaly or thyroid tenderness.  Cardiovascular:     Rate and Rhythm: Normal rate and regular rhythm.     Pulses:  Dorsalis pedis pulses are 1+ on the right side and 1+ on the left side.       Posterior tibial pulses are 1+ on the right side and 1+ on the left side.     Heart sounds: No murmur heard. Pulmonary:     Effort: Pulmonary effort is normal.     Breath sounds: No stridor. No wheezing, rhonchi or rales.  Abdominal:     General: Abdomen is flat.     Palpations: There is no mass.     Tenderness: There is no abdominal tenderness. There is no guarding.     Hernia: No hernia is present.  Musculoskeletal:     Cervical back: Neck supple.  Feet:     Right foot:     Skin integrity: Skin integrity normal. No blister, erythema or warmth.     Left foot:     Skin integrity: Skin integrity normal. No erythema or warmth.     Comments: +PW  plantar side of right foot Lymphadenopathy:     Cervical: No cervical adenopathy.  Skin:    General: Skin is warm and dry.  Neurological:     General: No focal deficit present.     Mental Status: She is alert.  Psychiatric:        Mood and Affect: Mood normal.        Behavior: Behavior normal.     Lab Results  Component Value Date   WBC 8.9 06/17/2022   HGB 13.4 06/17/2022   HCT 40.2 06/17/2022   PLT 358.0 06/17/2022   GLUCOSE 79 06/17/2022   CHOL 222 (H) 06/17/2022   TRIG 302.0 (H) 06/17/2022   HDL 50.90 06/17/2022   LDLDIRECT 135.0 06/17/2022   LDLCALC 159 (H) 08/19/2019   ALT 10 06/17/2022   AST 14 06/17/2022   NA 139 06/17/2022   K 4.0 06/17/2022   CL 104 06/17/2022   CREATININE 0.82 06/17/2022   BUN 18 06/17/2022   CO2 29 06/17/2022   TSH 0.94 05/20/2020   HGBA1C 5.8 08/19/2019    NM Whole Body I131 Scan W/Thyrogen  Result Date: 06/23/2017 CLINICAL DATA:  Thyroid cancer post thyroidectomy and postoperative radioactive iodine ablation EXAM: THYROGEN-STIMULATED I-131 WHOLE BODY SCAN TECHNIQUE: The patient received 0.9 mg Thyrogen intramuscularly every 24 hours for two doses. On the third day the patient returned and received the radiopharmaceutical, per orally. On the fifth day, the patient returned and whole body planar images were obtained in the anterior and posterior projections. RADIOPHARMACEUTICALS:  4.3 mCi I-131 sodium iodide orally COMPARISON:  08/05/2016 ; correlation CT chest 09/05/2016 FINDINGS: No abnormal tracer uptake in the cervical region. Focus of tracer accumulation at lower LEFT chest/LEFT upper quadrant again seen, unchanged. No evidence of tumor recurrence was seen at this site on the interval CT chest exam; this most likely represents tracer accumulation within the gastric fundus rather than the inferior LEFT lung. Excreted tracer within colon and urinary bladder. No additional sites of abnormal radio iodine accumulation are identified to suggest  iodine-avid metastatic thyroid cancer. IMPRESSION: No definite scintigraphic evidence of iodine-avid metastatic thyroid cancer. Electronically Signed   By: Lavonia Dana M.D.   On: 06/23/2017 12:19     Assessment & Plan:   Toni Ramirez was seen today for annual exam.  Diagnoses and all orders for this visit:  Acquired hypothyroidism- Managed by endocrinology. -     CBC with Differential/Platelet; Future -     Hepatic function panel; Future -     Basic  metabolic panel; Future -     Basic metabolic panel -     Hepatic function panel -     CBC with Differential/Platelet  Routine general medical examination at a health care facility- Exam completed, labs reviewed-statin therapy is not indicated, vaccines reviewed, cancer screenings addressed, pt ed material was given.  Hyperlipidemia with target LDL less than 130- Statin therapy is not indicated. -     Lipid panel; Future -     CBC with Differential/Platelet; Future -     Hepatic function panel; Future -     Basic metabolic panel; Future -     Basic metabolic panel -     Hepatic function panel -     CBC with Differential/Platelet -     Lipid panel  Puncture wound of right foot, initial encounter -     DG Foot Complete Right; Future  Thyroid cancer ([place])- There is no evidence of recurrence.  Partial nontraumatic rupture of left rotator cuff  Other orders -     Tdap vaccine greater than or equal to 7yo IM -     LDL cholesterol, direct   I am having Octavio Manns "Lisette Grinder" start on Shingrix. I am also having her maintain her Synthroid, Vitamin D (Ergocalciferol), diclofenac Sodium, Vitamin D (Ergocalciferol), esomeprazole, Collagen, cyclobenzaprine, and ALPRAZolam.  Meds ordered this encounter  Medications   Zoster Vaccine Adjuvanted [facility]) injection    Sig: Inject 0.5 mLs into the muscle once for 1 dose.    Dispense:  0.5 mL    Refill:  1     Follow-up: Return in about 6 months (around 12/17/2022).  Scarlette Calico, MD

## 2022-06-17 ENCOUNTER — Telehealth: Payer: Self-pay

## 2022-06-17 LAB — BASIC METABOLIC PANEL
BUN: 18 mg/dL (ref 6–23)
CO2: 29 mEq/L (ref 19–32)
Calcium: 9.2 mg/dL (ref 8.4–10.5)
Chloride: 104 mEq/L (ref 96–112)
Creatinine, Ser: 0.82 mg/dL (ref 0.40–1.20)
GFR: 74.19 mL/min (ref >60.00)
Glucose, Bld: 79 mg/dL (ref 70–99)
Potassium: 4 mEq/L (ref 3.5–5.1)
Sodium: 139 mEq/L (ref 135–145)

## 2022-06-17 LAB — LIPID PANEL
Cholesterol: 222 mg/dL — ABNORMAL HIGH (ref 0–200)
HDL: 50.9 mg/dL (ref >39.00)
NonHDL: 170.6
Total CHOL/HDL Ratio: 4
Triglycerides: 302 mg/dL — ABNORMAL HIGH (ref 0.0–149.0)
VLDL: 60.4 mg/dL — ABNORMAL HIGH (ref 0.0–40.0)

## 2022-06-17 LAB — CBC WITH DIFFERENTIAL/PLATELET
Basophils Absolute: 0 10*3/uL (ref 0.0–0.1)
Basophils Relative: 0.5 % (ref 0.0–3.0)
Eosinophils Absolute: 0.1 10*3/uL (ref 0.0–0.7)
Eosinophils Relative: 1.4 % (ref 0.0–5.0)
HCT: 40.2 % (ref 36.0–46.0)
Hemoglobin: 13.4 g/dL (ref 12.0–15.0)
Lymphocytes Relative: 37 % (ref 12.0–46.0)
Lymphs Abs: 3.3 10*3/uL (ref 0.7–4.0)
MCHC: 33.3 g/dL (ref 30.0–36.0)
MCV: 88.7 fl (ref 78.0–100.0)
Monocytes Absolute: 0.6 10*3/uL (ref 0.1–1.0)
Monocytes Relative: 7.1 % (ref 3.0–12.0)
Neutro Abs: 4.8 10*3/uL (ref 1.4–7.7)
Neutrophils Relative %: 54 % (ref 43.0–77.0)
Platelets: 358 10*3/uL (ref 150.0–400.0)
RBC: 4.53 Mil/uL (ref 3.87–5.11)
RDW: 13.3 % (ref 11.5–15.5)
WBC: 8.9 10*3/uL (ref 4.0–10.5)

## 2022-06-17 LAB — HEPATIC FUNCTION PANEL
ALT: 10 U/L (ref 0–35)
AST: 14 U/L (ref 0–37)
Albumin: 4.3 g/dL (ref 3.5–5.2)
Alkaline Phosphatase: 72 U/L (ref 39–117)
Bilirubin, Direct: 0.1 mg/dL (ref 0.0–0.3)
Total Bilirubin: 0.9 mg/dL (ref 0.2–1.2)
Total Protein: 7 g/dL (ref 6.0–8.3)

## 2022-06-17 LAB — LDL CHOLESTEROL, DIRECT: Direct LDL: 135 mg/dL

## 2022-06-19 MED ORDER — SHINGRIX 50 MCG/0.5ML IM SUSR: 0.5000 mL | Freq: Once | INTRAMUSCULAR | 1 refills | Status: AC

## 2022-06-21 ENCOUNTER — Other Ambulatory Visit: Payer: Self-pay

## 2022-06-21 NOTE — Telephone Encounter (Signed)
Called pt. LVM letting her know that Dr. Ronnald Ramp has placed a order to get a xray of her right foot. Office number was provided.    When patient calls back please schedule the pt an appt to have her xray completed. Order is already in the chart

## 2022-06-21 NOTE — Telephone Encounter (Signed)
Chart review.

## 2022-06-27 DIAGNOSIS — L578 Other skin changes due to chronic exposure to nonionizing radiation: Secondary | ICD-10-CM | POA: Diagnosis not present

## 2022-06-27 DIAGNOSIS — Z86018 Personal history of other benign neoplasm: Secondary | ICD-10-CM | POA: Diagnosis not present

## 2022-06-27 DIAGNOSIS — L821 Other seborrheic keratosis: Secondary | ICD-10-CM | POA: Diagnosis not present

## 2022-06-27 DIAGNOSIS — D225 Melanocytic nevi of trunk: Secondary | ICD-10-CM | POA: Diagnosis not present

## 2022-06-27 DIAGNOSIS — L814 Other melanin hyperpigmentation: Secondary | ICD-10-CM | POA: Diagnosis not present

## 2022-06-28 DIAGNOSIS — C73 Malignant neoplasm of thyroid gland: Secondary | ICD-10-CM | POA: Diagnosis not present

## 2022-10-11 DIAGNOSIS — Z1231 Encounter for screening mammogram for malignant neoplasm of breast: Secondary | ICD-10-CM | POA: Diagnosis not present

## 2022-10-11 DIAGNOSIS — Z124 Encounter for screening for malignant neoplasm of cervix: Secondary | ICD-10-CM | POA: Diagnosis not present

## 2022-10-11 DIAGNOSIS — K5909 Other constipation: Secondary | ICD-10-CM | POA: Diagnosis not present

## 2022-10-11 DIAGNOSIS — N951 Menopausal and female climacteric states: Secondary | ICD-10-CM | POA: Diagnosis not present

## 2022-10-11 DIAGNOSIS — Z01419 Encounter for gynecological examination (general) (routine) without abnormal findings: Secondary | ICD-10-CM | POA: Diagnosis not present

## 2022-10-11 DIAGNOSIS — Z6831 Body mass index (BMI) 31.0-31.9, adult: Secondary | ICD-10-CM | POA: Diagnosis not present

## 2022-10-11 DIAGNOSIS — M8580 Other specified disorders of bone density and structure, unspecified site: Secondary | ICD-10-CM | POA: Diagnosis not present

## 2022-10-11 DIAGNOSIS — Z01411 Encounter for gynecological examination (general) (routine) with abnormal findings: Secondary | ICD-10-CM | POA: Diagnosis not present

## 2022-10-11 DIAGNOSIS — K649 Unspecified hemorrhoids: Secondary | ICD-10-CM | POA: Diagnosis not present

## 2023-01-24 ENCOUNTER — Telehealth: Payer: Self-pay

## 2023-01-24 NOTE — Telephone Encounter (Signed)
Called patient to schedule Medicare Annual Wellness Visit (AWV). Left message for patient to call back and schedule Medicare Annual Wellness Visit (AWV).  Last date of AWV: Eligible for AWV- I 05/14/21  Please schedule an appointment at any time with [facility].   Norton Blizzard, [place] ([facility])  [place] [facility] [phone]

## 2023-04-25 DIAGNOSIS — C73 Malignant neoplasm of thyroid gland: Secondary | ICD-10-CM | POA: Diagnosis not present

## 2023-04-25 DIAGNOSIS — E890 Postprocedural hypothyroidism: Secondary | ICD-10-CM | POA: Diagnosis not present

## 2023-04-25 LAB — TSH: TSH: 1.89 (ref 0.41–5.90)

## 2023-05-02 DIAGNOSIS — C73 Malignant neoplasm of thyroid gland: Secondary | ICD-10-CM | POA: Diagnosis not present

## 2023-05-02 DIAGNOSIS — R635 Abnormal weight gain: Secondary | ICD-10-CM | POA: Diagnosis not present

## 2023-05-02 DIAGNOSIS — E890 Postprocedural hypothyroidism: Secondary | ICD-10-CM | POA: Diagnosis not present

## 2023-06-06 DIAGNOSIS — R635 Abnormal weight gain: Secondary | ICD-10-CM | POA: Diagnosis not present

## 2023-06-06 LAB — COMPREHENSIVE METABOLIC PANEL: eGFR: 73

## 2023-06-06 LAB — BASIC METABOLIC PANEL
BUN: 17 (ref 4–21)
Creatinine: 0.9 (ref 0.5–1.1)
Glucose: 95

## 2023-06-06 LAB — HEMOGLOBIN A1C: Hemoglobin A1C: 5.5

## 2023-06-13 DIAGNOSIS — C73 Malignant neoplasm of thyroid gland: Secondary | ICD-10-CM | POA: Diagnosis not present

## 2023-06-13 DIAGNOSIS — E890 Postprocedural hypothyroidism: Secondary | ICD-10-CM | POA: Diagnosis not present

## 2023-06-29 ENCOUNTER — Encounter (INDEPENDENT_AMBULATORY_CARE_PROVIDER_SITE_OTHER): Payer: Self-pay

## 2023-07-07 DIAGNOSIS — Z8262 Family history of osteoporosis: Secondary | ICD-10-CM | POA: Diagnosis not present

## 2023-07-07 DIAGNOSIS — M8588 Other specified disorders of bone density and structure, other site: Secondary | ICD-10-CM | POA: Diagnosis not present

## 2023-07-07 DIAGNOSIS — Z85850 Personal history of malignant neoplasm of thyroid: Secondary | ICD-10-CM | POA: Diagnosis not present

## 2023-07-07 LAB — HM DEXA SCAN

## 2023-07-25 ENCOUNTER — Encounter: Payer: Medicare PPO | Admitting: Internal Medicine

## 2023-08-21 ENCOUNTER — Encounter: Payer: Self-pay | Admitting: Internal Medicine

## 2023-08-21 ENCOUNTER — Ambulatory Visit: Payer: Medicare PPO | Admitting: Internal Medicine

## 2023-08-21 VITALS — BP 118/68 | HR 81 | Temp 97.9°F | Resp 16 | Ht 65.0 in | Wt 189.0 lb

## 2023-08-21 DIAGNOSIS — F40243 Fear of flying: Secondary | ICD-10-CM

## 2023-08-21 DIAGNOSIS — K921 Melena: Secondary | ICD-10-CM

## 2023-08-21 DIAGNOSIS — E039 Hypothyroidism, unspecified: Secondary | ICD-10-CM | POA: Diagnosis not present

## 2023-08-21 DIAGNOSIS — M25512 Pain in left shoulder: Secondary | ICD-10-CM | POA: Diagnosis not present

## 2023-08-21 DIAGNOSIS — G8929 Other chronic pain: Secondary | ICD-10-CM | POA: Diagnosis not present

## 2023-08-21 DIAGNOSIS — E785 Hyperlipidemia, unspecified: Secondary | ICD-10-CM | POA: Diagnosis not present

## 2023-08-21 DIAGNOSIS — Z0001 Encounter for general adult medical examination with abnormal findings: Secondary | ICD-10-CM | POA: Diagnosis not present

## 2023-08-21 DIAGNOSIS — Z23 Encounter for immunization: Secondary | ICD-10-CM

## 2023-08-21 DIAGNOSIS — Z1231 Encounter for screening mammogram for malignant neoplasm of breast: Secondary | ICD-10-CM

## 2023-08-21 HISTORY — DX: Encounter for immunization: Z23

## 2023-08-21 HISTORY — DX: Encounter for screening mammogram for malignant neoplasm of breast: Z12.31

## 2023-08-21 NOTE — Patient Instructions (Signed)

## 2023-08-21 NOTE — Progress Notes (Signed)
Subjective:  Patient ID: Toni Ramirez, female    DOB: [birth year]  Age: 67 y.o. MRN: [id number]  CC: Annual Exam, Hypothyroidism, Hypertension, and Hyperlipidemia   HPI Toni Ramirez presents for a CPX and f/up -----  Discussed the use of AI scribe software for clinical note transcription with the patient, who gave verbal consent to proceed.  History of Present Illness   The patient, with a history of thyroid cancer, presents with concerns about a persistent issue with her left shoulder, which has been ongoing since 2019. Despite previous consultations and a period of perceived improvement, the patient reports difficulty lifting weights and performing certain movements. She suspects a minor tear or rotator cuff issue.  Additionally, the patient reports symptoms suggestive of hemorrhoids, including occasional bleeding, itching, and significant discomfort, which is somewhat alleviated by dietary adjustments.  The patient also mentions a toenail fungus on the left big toe, which she has been managing by keeping the nail trimmed short. She expresses interest in potential treatment options.  In terms of general health, the patient reports being physically active, engaging in regular exercise with a personal trainer, daily walks, and mountain hikes. She denies any chest pain, fatigue, or shortness of breath during these activities. She also reports that her thyroid levels are well-controlled and that she is no longer pre-diabetic. She has recently completed a bone density test, which showed no significant issues, only mild osteopenia. She is due for a mammogram, having had her last one a year ago.       Outpatient Medications Prior to Visit  Medication Sig Dispense Refill   cyclobenzaprine (FLEXERIL) 5 MG tablet Take 1 tablet (5 mg total) by mouth 3 (three) times daily as needed for muscle spasms. 40 tablet 1   Diclofenac Sodium 2 % SOLN Place 2 g onto the skin 2 (two) times daily. 112 g 3    Emollient (COLLAGEN) CREA See admin instructions.     esomeprazole (NEXIUM) 40 MG capsule Take 1 capsule (40 mg total) by mouth daily. 30 capsule 3   SYNTHROID 112 MCG tablet Take 1 tablet (112 mcg total) by mouth daily. 90 tablet 1   Vitamin D, Ergocalciferol, (DRISDOL) 1.25 MG (50000 UNIT) CAPS capsule Take 1 capsule (50,000 Units total) by mouth every 7 (seven) days. 12 capsule 0   Vitamin D, Ergocalciferol, (DRISDOL) 1.25 MG (50000 UNIT) CAPS capsule Take 1 capsule (50,000 Units total) by mouth every 7 (seven) days. 12 capsule 0   ALPRAZolam (XANAX) 0.5 MG tablet Take 1 tablet (0.5 mg total) by mouth at bedtime as needed for anxiety. 30 tablet 1   No facility-administered medications prior to visit.    ROS Review of Systems  Constitutional:  Negative for appetite change, chills, diaphoresis, fatigue and fever.  HENT: Negative.    Eyes:  Negative for visual disturbance.  Respiratory:  Negative for cough, chest tightness and shortness of breath.   Cardiovascular:  Negative for chest pain, palpitations and leg swelling.  Gastrointestinal:  Positive for anal bleeding, blood in stool and rectal pain. Negative for abdominal pain, diarrhea, nausea and vomiting.  Endocrine: Negative.   Genitourinary:  Negative for difficulty urinating.  Musculoskeletal:  Positive for arthralgias.  Skin: Negative.   Neurological:  Negative for dizziness, weakness, light-headedness and headaches.  Hematological:  Negative for adenopathy. Does not bruise/bleed easily.  Psychiatric/Behavioral: Negative.      Objective:  BP 118/68 (BP Location: Left Arm, Patient Position: Sitting, Cuff Size: Large)   Pulse 81  Temp 97.9 F (36.6 C) (Oral)   Resp 16   Ht 5\' 5"  (1.651 m)   Wt 189 lb (85.7 kg)   LMP 06/14/2010   SpO2 98%   BMI 31.45 kg/m   BP Readings from Last 3 Encounters:  08/21/23 118/68  06/16/22 118/72  05/07/21 120/60    Wt Readings from Last 3 Encounters:  08/21/23 189 lb (85.7 kg)   06/16/22 182 lb (82.6 kg)  05/07/21 172 lb 8 oz (78.2 kg)    Physical Exam Vitals reviewed.  Constitutional:      Appearance: Normal appearance.  HENT:     Nose: Nose normal.  Eyes:     General: No scleral icterus.    Conjunctiva/sclera: Conjunctivae normal.  Neck:     Thyroid: No thyroid mass, thyromegaly or thyroid tenderness.  Cardiovascular:     Rate and Rhythm: Normal rate and regular rhythm.     Heart sounds: No murmur heard.    No friction rub. No gallop.  Pulmonary:     Effort: Pulmonary effort is normal.     Breath sounds: No stridor. No wheezing, rhonchi or rales.  Abdominal:     General: Abdomen is flat.     Palpations: There is no mass.     Tenderness: There is no abdominal tenderness. There is no guarding.     Hernia: No hernia is present.  Musculoskeletal:        General: Normal range of motion.     Cervical back: Neck supple.     Right lower leg: No edema.     Left lower leg: No edema.  Lymphadenopathy:     Cervical: No cervical adenopathy.  Skin:    General: Skin is warm and dry.  Neurological:     General: No focal deficit present.     Mental Status: She is alert. Mental status is at baseline.  Psychiatric:        Mood and Affect: Mood normal.        Behavior: Behavior normal.     Lab Results  Component Value Date   WBC 8.9 06/17/2022   HGB 13.4 06/17/2022   HCT 40.2 06/17/2022   PLT 358.0 06/17/2022   GLUCOSE 79 06/17/2022   CHOL 222 (H) 06/17/2022   TRIG 302.0 (H) 06/17/2022   HDL 50.90 06/17/2022   LDLDIRECT 135.0 06/17/2022   LDLCALC 159 (H) 08/19/2019   ALT 10 06/17/2022   AST 14 06/17/2022   NA 139 06/17/2022   K 4.0 06/17/2022   CL 104 06/17/2022   CREATININE 0.9 06/06/2023   BUN 17 06/06/2023   CO2 29 06/17/2022   TSH 1.89 04/25/2023   HGBA1C 5.5 06/06/2023    NM Whole Body I131 Scan W/Thyrogen  Result Date: 06/23/2017 CLINICAL DATA:  Thyroid cancer post thyroidectomy and postoperative radioactive iodine ablation EXAM:  THYROGEN-STIMULATED I-131 WHOLE BODY SCAN TECHNIQUE: The patient received 0.9 mg Thyrogen intramuscularly every 24 hours for two doses. On the third day the patient returned and received the radiopharmaceutical, per orally. On the fifth day, the patient returned and whole body planar images were obtained in the anterior and posterior projections. RADIOPHARMACEUTICALS:  4.3 mCi I-131 sodium iodide orally COMPARISON:  08/05/2016 ; correlation CT chest 09/05/2016 FINDINGS: No abnormal tracer uptake in the cervical region. Focus of tracer accumulation at lower LEFT chest/LEFT upper quadrant again seen, unchanged. No evidence of tumor recurrence was seen at this site on the interval CT chest exam; this most likely represents tracer accumulation within  the gastric fundus rather than the inferior LEFT lung. Excreted tracer within colon and urinary bladder. No additional sites of abnormal radio iodine accumulation are identified to suggest iodine-avid metastatic thyroid cancer. IMPRESSION: No definite scintigraphic evidence of iodine-avid metastatic thyroid cancer. Electronically Signed   By: Ulyses Southward M.D.   On: 06/23/2017 12:19    Assessment & Plan:  Blood in stool, frank -     Ambulatory referral to Gastroenterology -     CBC with Differential/Platelet; Future  Flu vaccine need -     Flu Vaccine Trivalent High Dose (Fluad)  Need for vaccination -     Pneumococcal conjugate vaccine 20-valent  Screening mammogram for breast cancer -     Digital Screening Mammogram, Left and Right; Future  Chronic left shoulder pain -     Ambulatory referral to Sports Medicine  Acquired hypothyroidism- She is euthroid. -     Hepatic function panel; Future  Hyperlipidemia with target LDL less than 130- Will consider staring a  statin. -     Lipid panel; Future -     Hepatic function panel; Future  Fear of flying -     ALPRAZolam; Take 1 tablet (0.5 mg total) by mouth at bedtime as needed for anxiety.  Dispense:  30 tablet; Refill: 1  Encounter for general adult medical examination with abnormal findings - Exam completed, labs ordered, vaccines reviewed and updated, cancer screenings addressed, pt ed material was given.      Follow-up: Return in about 6 months (around 02/19/2024).  Sanda Linger, MD

## 2023-08-22 MED ORDER — ALPRAZOLAM 0.5 MG PO TABS: 0.5000 mg | ORAL_TABLET | Freq: Every evening | ORAL | 1 refills | Status: DC | PRN

## 2023-08-27 DIAGNOSIS — Z0001 Encounter for general adult medical examination with abnormal findings: Secondary | ICD-10-CM

## 2023-08-27 HISTORY — DX: Encounter for general adult medical examination with abnormal findings: Z00.01

## 2023-08-29 ENCOUNTER — Ambulatory Visit: Payer: Medicare PPO | Admitting: Family Medicine

## 2023-08-29 ENCOUNTER — Other Ambulatory Visit (INDEPENDENT_AMBULATORY_CARE_PROVIDER_SITE_OTHER): Payer: Medicare PPO

## 2023-08-29 ENCOUNTER — Ambulatory Visit: Payer: Self-pay

## 2023-08-29 ENCOUNTER — Ambulatory Visit (INDEPENDENT_AMBULATORY_CARE_PROVIDER_SITE_OTHER): Payer: Medicare PPO

## 2023-08-29 VITALS — BP 112/76 | HR 58 | Ht 65.0 in | Wt 189.0 lb

## 2023-08-29 DIAGNOSIS — M85812 Other specified disorders of bone density and structure, left shoulder: Secondary | ICD-10-CM | POA: Diagnosis not present

## 2023-08-29 DIAGNOSIS — G8929 Other chronic pain: Secondary | ICD-10-CM

## 2023-08-29 DIAGNOSIS — E669 Obesity, unspecified: Secondary | ICD-10-CM | POA: Diagnosis not present

## 2023-08-29 DIAGNOSIS — Z860100 Personal history of colon polyps, unspecified: Secondary | ICD-10-CM | POA: Diagnosis not present

## 2023-08-29 DIAGNOSIS — M25512 Pain in left shoulder: Secondary | ICD-10-CM

## 2023-08-29 DIAGNOSIS — M19012 Primary osteoarthritis, left shoulder: Secondary | ICD-10-CM | POA: Diagnosis not present

## 2023-08-29 DIAGNOSIS — E785 Hyperlipidemia, unspecified: Secondary | ICD-10-CM | POA: Diagnosis not present

## 2023-08-29 DIAGNOSIS — K625 Hemorrhage of anus and rectum: Secondary | ICD-10-CM | POA: Diagnosis not present

## 2023-08-29 DIAGNOSIS — E039 Hypothyroidism, unspecified: Secondary | ICD-10-CM

## 2023-08-29 DIAGNOSIS — K921 Melena: Secondary | ICD-10-CM

## 2023-08-29 DIAGNOSIS — Z1211 Encounter for screening for malignant neoplasm of colon: Secondary | ICD-10-CM | POA: Diagnosis not present

## 2023-08-29 DIAGNOSIS — K642 Third degree hemorrhoids: Secondary | ICD-10-CM | POA: Diagnosis not present

## 2023-08-29 LAB — CBC WITH DIFFERENTIAL/PLATELET
Basophils Absolute: 0 10*3/uL (ref 0.0–0.1)
Basophils Relative: 0.6 % (ref 0.0–3.0)
Eosinophils Absolute: 0.1 10*3/uL (ref 0.0–0.7)
Eosinophils Relative: 2.2 % (ref 0.0–5.0)
HCT: 42.5 % (ref 36.0–46.0)
Hemoglobin: 13.9 g/dL (ref 12.0–15.0)
Lymphocytes Relative: 43.8 % (ref 12.0–46.0)
Lymphs Abs: 2.8 10*3/uL (ref 0.7–4.0)
MCHC: 32.7 g/dL (ref 30.0–36.0)
MCV: 88.9 fL (ref 78.0–100.0)
Monocytes Absolute: 0.6 10*3/uL (ref 0.1–1.0)
Monocytes Relative: 8.6 % (ref 3.0–12.0)
Neutro Abs: 2.9 10*3/uL (ref 1.4–7.7)
Neutrophils Relative %: 44.8 % (ref 43.0–77.0)
Platelets: 395 10*3/uL (ref 150.0–400.0)
RBC: 4.78 Mil/uL (ref 3.87–5.11)
RDW: 13.7 % (ref 11.5–15.5)
WBC: 6.4 10*3/uL (ref 4.0–10.5)

## 2023-08-29 LAB — HEPATIC FUNCTION PANEL
ALT: 11 U/L (ref 0–35)
AST: 17 U/L (ref 0–37)
Albumin: 4.4 g/dL (ref 3.5–5.2)
Alkaline Phosphatase: 69 U/L (ref 39–117)
Bilirubin, Direct: 0.1 mg/dL (ref 0.0–0.3)
Total Bilirubin: 0.7 mg/dL (ref 0.2–1.2)
Total Protein: 7.6 g/dL (ref 6.0–8.3)

## 2023-08-29 LAB — LIPID PANEL
Cholesterol: 250 mg/dL — ABNORMAL HIGH (ref 0–200)
HDL: 61.8 mg/dL (ref >39.00)
LDL Cholesterol: 165 mg/dL — ABNORMAL HIGH (ref 0–99)
NonHDL: 188.08
Total CHOL/HDL Ratio: 4
Triglycerides: 116 mg/dL (ref 0.0–149.0)
VLDL: 23.2 mg/dL (ref 0.0–40.0)

## 2023-08-29 NOTE — Patient Instructions (Addendum)
Thank you for coming in today.   Please get an Xray today before you leave   You should hear from MRI scheduling within 1 week. If you do not hear please let me know.    Recheck once we get the MRI read.

## 2023-08-29 NOTE — Progress Notes (Signed)
Rubin Payor, PhD, LAT, ATC acting as a scribe for Clementeen Graham, MD.  Toni Ramirez is a 67 y.o. female who presents to [facility] today for L shoulder pain that began in Sept 2019 and has become more bothersome. She works out w/ a Systems analyst and does yoga. Pt locates pain to the posterior aspect of her L shoulder and then into the proximal upper arm.  She has been working very significantly with home exercise program directed by originally PCP and subsequently PT/personal trainer.  This is improved her symptoms significantly but she is not fully better and having difficulty with overhead strength and motion.  Radiates: no Aggravates: overhead end ROM,  Treatments tried: prior steroid injections,   Pertinent review of systems: No fevers or chills  Relevant historical information: History of thyroid cancer   Exam:  BP 112/76   Pulse (!) 58   Ht 5\' 5"  (1.651 m)   Wt 189 lb (85.7 kg)   LMP 06/14/2010   SpO2 98%   BMI 31.45 kg/m  General: Well Developed, well nourished, and in no acute distress.   MSK: Left shoulder normal-appearing. Range of motion abduction 130 degrees.  Functional internal rotation lumbar spine external rotation 20 degrees beyond neutral position. Strength abduction 4/5 external rotation 4/5 internal rotation 5/5. Negative Yergason's and speeds test.  Mildly positive empty can test. Negative Hawkins and Neer's test. Pulses cap refill and sensation are intact distally.   Lab and Radiology Results  X-ray images left shoulder obtained today personally and independently interpreted Mild glenohumeral DJD.  Mild AC DJD.  No acute fractures are visible. Await formal radiology review     Assessment and Plan: 67 y.o. female with chronic left shoulder pain with lack of range of motion and overhead strength.  This has been ongoing for years.  She has had some improvement with PT/home exercise program but is plateauing.   Clinically she acts as though she may have a rotator cuff tear with adhesive capsulitis or perhaps exacerbation of shoulder arthritis.  Plan for MRI to further evaluate the sources of pain for potential next step planning including injections or surgery.  Recheck after MRI.   PDMP not reviewed this encounter. Orders Placed This Encounter  Procedures   [place] LIMITED JOINT SPACE STRUCTURES UP LEFT(NO LINKED CHARGES)    Order Specific Question:   Reason for Exam (SYMPTOM  OR DIAGNOSIS REQUIRED)    Answer:   left shoulder pain    Order Specific Question:   Preferred imaging location?    Answer:   [facility] Shoulder Left    Standing Status:   Future    Number of Occurrences:   1    Standing Expiration Date:   09/29/2023    Order Specific Question:   Reason for Exam (SYMPTOM  OR DIAGNOSIS REQUIRED)    Answer:   left shoulder pain    Order Specific Question:   Preferred imaging location?    Answer:   [facility]   MR SHOULDER LEFT WO CONTRAST    Standing Status:   Future    Standing Expiration Date:   08/28/2024    Order Specific Question:   What is the patient's sedation requirement?    Answer:   No Sedation    Order Specific Question:   Does the patient have a pacemaker or implanted devices?    Answer:   No    Order Specific  Question:   Preferred imaging location?    Answer:   [facility] (table limit-550lbs)   No orders of the defined types were placed in this encounter.    Discussed warning signs or symptoms. Please see discharge instructions. Patient expresses understanding.   The above documentation has been reviewed and is accurate and complete Clementeen Graham, M.D.

## 2023-09-13 DIAGNOSIS — E890 Postprocedural hypothyroidism: Secondary | ICD-10-CM | POA: Diagnosis not present

## 2023-09-13 DIAGNOSIS — C73 Malignant neoplasm of thyroid gland: Secondary | ICD-10-CM | POA: Diagnosis not present

## 2023-09-19 NOTE — Progress Notes (Signed)
Left shoulder x-ray shows medium arthritis of the main shoulder joint.

## 2023-09-28 DIAGNOSIS — K642 Third degree hemorrhoids: Secondary | ICD-10-CM | POA: Diagnosis not present

## 2023-09-28 DIAGNOSIS — Z860100 Personal history of colon polyps, unspecified: Secondary | ICD-10-CM | POA: Diagnosis not present

## 2023-09-28 DIAGNOSIS — Z1211 Encounter for screening for malignant neoplasm of colon: Secondary | ICD-10-CM | POA: Diagnosis not present

## 2023-09-28 DIAGNOSIS — E669 Obesity, unspecified: Secondary | ICD-10-CM | POA: Diagnosis not present

## 2023-09-28 DIAGNOSIS — K625 Hemorrhage of anus and rectum: Secondary | ICD-10-CM | POA: Diagnosis not present

## 2023-09-28 DIAGNOSIS — K5904 Chronic idiopathic constipation: Secondary | ICD-10-CM | POA: Diagnosis not present

## 2023-10-10 DIAGNOSIS — L820 Inflamed seborrheic keratosis: Secondary | ICD-10-CM | POA: Diagnosis not present

## 2023-10-10 DIAGNOSIS — D692 Other nonthrombocytopenic purpura: Secondary | ICD-10-CM | POA: Diagnosis not present

## 2023-10-14 ENCOUNTER — Ambulatory Visit
Admission: RE | Admit: 2023-10-14 | Discharge: 2023-10-14 | Disposition: A | Discharge status: Home / Self Care | Payer: Medicare PPO | Source: Ambulatory Visit | Attending: Family Medicine | Admitting: Family Medicine

## 2023-10-14 DIAGNOSIS — M25512 Pain in left shoulder: Secondary | ICD-10-CM | POA: Diagnosis not present

## 2023-10-14 DIAGNOSIS — M19012 Primary osteoarthritis, left shoulder: Secondary | ICD-10-CM | POA: Diagnosis not present

## 2023-10-14 DIAGNOSIS — G8929 Other chronic pain: Secondary | ICD-10-CM

## 2023-10-27 NOTE — Progress Notes (Signed)
Left shoulder MRI shows advanced arthritis of the main shoulder joint.  That is the main source of pain.  There is some rotator cuff tendinitis but no tear.  Your x-ray showed medium arthritis.  The MRI shows much more severe shoulder arthritis.  Unfortunately you may require a shoulder replacement before too long.  Do to you want me to place a referral now or do you want to return to clinic to talk about the results in full detail and potentially do an injection?

## 2023-10-31 ENCOUNTER — Encounter: Payer: Self-pay | Admitting: Family Medicine

## 2023-11-28 DIAGNOSIS — Z86018 Personal history of other benign neoplasm: Secondary | ICD-10-CM | POA: Diagnosis not present

## 2023-11-28 DIAGNOSIS — L570 Actinic keratosis: Secondary | ICD-10-CM | POA: Diagnosis not present

## 2023-11-28 DIAGNOSIS — L814 Other melanin hyperpigmentation: Secondary | ICD-10-CM | POA: Diagnosis not present

## 2023-11-28 DIAGNOSIS — L821 Other seborrheic keratosis: Secondary | ICD-10-CM | POA: Diagnosis not present

## 2023-11-28 DIAGNOSIS — D225 Melanocytic nevi of trunk: Secondary | ICD-10-CM | POA: Diagnosis not present

## 2023-11-28 DIAGNOSIS — L578 Other skin changes due to chronic exposure to nonionizing radiation: Secondary | ICD-10-CM | POA: Diagnosis not present

## 2023-11-28 DIAGNOSIS — L820 Inflamed seborrheic keratosis: Secondary | ICD-10-CM | POA: Diagnosis not present

## 2023-11-28 DIAGNOSIS — Z411 Encounter for cosmetic surgery: Secondary | ICD-10-CM | POA: Diagnosis not present

## 2023-12-06 LAB — HM MAMMOGRAPHY: HM Mammogram: NORMAL (ref 0–4)

## 2024-01-16 ENCOUNTER — Ambulatory Visit: Payer: Self-pay | Admitting: Internal Medicine

## 2024-01-16 NOTE — Progress Notes (Unsigned)
   Acute Office Visit  Subjective:     Patient ID: Toni Ramirez, female    DOB: November 28, 1955, 68 y.o.   MRN: [id number]  No chief complaint on file.   HPI Patient is in today for evaluation of rash to her groin.  ROS Per HPI      Objective:    LMP 06/14/2010    Physical Exam  No results found for any visits on 01/17/24.      Assessment & Plan:  ***  No orders of the defined types were placed in this encounter.   No follow-ups on file.  Moshe Cipro, FNP

## 2024-01-16 NOTE — Telephone Encounter (Signed)
 Copied from CRM [id number]. Topic: Clinical - Red Word Triage >> Jan 16, 2024 11:57 AM Drema Balzarine wrote: Red Word that prompted transfer to Nurse Triage: Patient has acute visit scheduled for tomorrow and wanted a sooner appointment due to rash on groin area getting worse. There are no sooner appointments, patients says rash feels like sharps of glass and it is now infected  Chief Complaint: red raised rash to skin folds ndt groin to mons pubis area Symptoms: pain itching, lt yellow drainage Frequency: last week Pertinent Negatives: Patient denies fever Disposition: [] ED /[] Urgent Care (no appt availability in office) / [] Appointment(In office/virtual)/ []  [facility]/ [] Home Care/ [] Refused Recommended Disposition /[]  Mobile Bus/ [x]  Follow-up with PCP Additional Notes: pt advised to try Dermaplast for the pain the topical Bacitracin in a thin layer. Apply Telfa or cotton mai pad to collect the drainage. Advised antibacterial soap and pat dry and /or use a blow dryer on cool setting to dry the area. Can take Tylenol or Ibuprofen for pain. Pt has appt tomorrow.  Reason for Disposition  [1] Applying cream or ointment AND [2] causes severe itch, burning or pain  Answer Assessment - Initial Assessment Questions 1. APPEARANCE of RASH: "Describe the rash."      Red raised  2. LOCATION: "Where is the rash located?"       Front of mons pubis parallel along the crease Groin    5. ONSET: "When did the rash start?"      Last week (wed/Thursday  6. ITCHING: "Does the rash itch?" If Yes, ask: "How bad is the itch?"  (Scale 0-10; or none, mild, moderate, severe)     yes 7. PAIN: "Does the rash hurt?" If Yes, ask: "How bad is the pain?"  (Scale 0-10; or none, mild, moderate, severe)    - NONE (0): no pain    - MILD (1-3): doesn't interfere with normal activities     - MODERATE (4-7): interferes with normal activities or awakens from sleep     - SEVERE (8-10): excruciating pain,  unable to do any normal activities     severe 8. OTHER SYMPTOMS: "Do you have any other symptoms?" (e.g., fever)   Lt yellow  oozing  Protocols used: Rash or Redness - Localized-A-AH

## 2024-01-17 ENCOUNTER — Encounter: Payer: Self-pay | Admitting: Family Medicine

## 2024-01-17 ENCOUNTER — Ambulatory Visit: Admitting: Family Medicine

## 2024-01-17 VITALS — BP 120/70 | HR 73 | Temp 98.1°F | Ht 65.0 in | Wt 185.2 lb

## 2024-01-17 DIAGNOSIS — R21 Rash and other nonspecific skin eruption: Secondary | ICD-10-CM

## 2024-01-17 DIAGNOSIS — K641 Second degree hemorrhoids: Secondary | ICD-10-CM

## 2024-01-17 DIAGNOSIS — B372 Candidiasis of skin and nail: Secondary | ICD-10-CM | POA: Diagnosis not present

## 2024-01-17 DIAGNOSIS — R300 Dysuria: Secondary | ICD-10-CM | POA: Diagnosis not present

## 2024-01-17 LAB — POCT URINALYSIS DIP (CLINITEK)
Bilirubin, UA: NEGATIVE
Blood, UA: NEGATIVE
Glucose, UA: NEGATIVE mg/dL
Ketones, POC UA: NEGATIVE mg/dL
Leukocytes, UA: NEGATIVE
Nitrite, UA: NEGATIVE
Spec Grav, UA: >=1.03 — AB (ref 1.010–1.025)
Urobilinogen, UA: 0.2 U/dL
pH, UA: 6 (ref 5.0–8.0)

## 2024-01-17 MED ORDER — CLOTRIMAZOLE-BETAMETHASONE 1-0.05 % EX CREA: 1.0000 | TOPICAL_CREAM | Freq: Two times a day (BID) | CUTANEOUS | 0 refills | Status: DC

## 2024-01-17 NOTE — Patient Instructions (Signed)
 I have sent in Lotrisone cream for you to use to the area twice a day.  May also use this to the skin around the hemorrhoids, as these look like they are also developing a fungal infection.  I would have you follow-up with surgery if you would like to have the hemorrhoids removed.  Urine today looked totally normal, I do not see any concerns for infection.  Follow-up with me for new or worsening symptoms.

## 2024-02-19 ENCOUNTER — Ambulatory Visit: Payer: Medicare PPO | Admitting: Internal Medicine

## 2024-02-19 ENCOUNTER — Encounter: Payer: Self-pay | Admitting: Internal Medicine

## 2024-02-19 VITALS — BP 128/76 | HR 62 | Temp 98.4°F | Resp 16 | Ht 65.0 in | Wt 185.8 lb

## 2024-02-19 DIAGNOSIS — K219 Gastro-esophageal reflux disease without esophagitis: Secondary | ICD-10-CM | POA: Diagnosis not present

## 2024-02-19 DIAGNOSIS — E039 Hypothyroidism, unspecified: Secondary | ICD-10-CM | POA: Diagnosis not present

## 2024-02-19 HISTORY — DX: Gastro-esophageal reflux disease without esophagitis: K21.9

## 2024-02-19 NOTE — Patient Instructions (Signed)

## 2024-02-19 NOTE — Progress Notes (Unsigned)
 Subjective:  Patient ID: Toni Ramirez, female    DOB: 1956/09/28  Age: 68 y.o. MRN: [id number]  CC: Hypothyroidism   HPI Toni Ramirez presents for f/up ---  Discussed the use of AI scribe software for clinical note transcription with the patient, who gave verbal consent to proceed.  History of Present Illness   Toni Ramirez is a 68 year old female who presents with concerns about weight management and routine health maintenance.  She is experiencing frustration with her weight management despite feeling she has been doing better than ever before. She has initiated a 25-day plan to modify her diet by limiting sugar and carbohydrates and reducing alcohol intake to see if it affects her weight. She feels good during exercise without experiencing chest pain, shortness of breath, or dizziness.  No headaches, blurred vision, or swelling in her legs or feet.  She reports chronic constipation but notes some improvement recently. No abdominal pain.  Her last thyroid level check was in June of the previous year, and it was normal. She is ten years post-thyroid surgery and does not experience any pain or nodules in the area. She believes she has a parathyroid but no thyroid.  She is due for a mammogram and a colonoscopy. She had scheduled a colonoscopy, but it was canceled due to the physician's injury, and she has yet to reschedule it. She plans to complete both the mammogram and colonoscopy soon.       Outpatient Medications Prior to Visit  Medication Sig Dispense Refill   ALPRAZolam (XANAX) 0.5 MG tablet Take 1 tablet (0.5 mg total) by mouth at bedtime as needed for anxiety. (Patient taking differently: Take 0.5 mg by mouth at bedtime as needed for anxiety. Patient only uses this when she's FLYING.) 30 tablet 1   SYNTHROID 112 MCG tablet Take 1 tablet (112 mcg total) by mouth daily. 90 tablet 1   clotrimazole-betamethasone (LOTRISONE) cream Apply 1 Application topically 2 (two) times  daily. 30 g 0   Diclofenac Sodium 2 % SOLN Place 2 g onto the skin 2 (two) times daily. 112 g 3   esomeprazole (NEXIUM) 40 MG capsule Take 1 capsule (40 mg total) by mouth daily. 30 capsule 3   No facility-administered medications prior to visit.    ROS Review of Systems  Musculoskeletal:  Negative for neck pain.    Objective:  BP 128/76 (BP Location: Right Arm, Patient Position: Sitting, Cuff Size: Normal)   Pulse 62   Temp 98.4 F (36.9 C) (Oral)   Resp 16   Ht 5\' 5"  (1.651 m)   Wt 185 lb 12.8 oz (84.3 kg)   LMP 06/14/2010   SpO2 99%   BMI 30.92 kg/m   BP Readings from Last 3 Encounters:  02/19/24 128/76  01/17/24 120/70  08/29/23 112/76    Wt Readings from Last 3 Encounters:  02/19/24 185 lb 12.8 oz (84.3 kg)  01/17/24 185 lb 3.2 oz (84 kg)  08/29/23 189 lb (85.7 kg)    Physical Exam Vitals reviewed.  Constitutional:      Appearance: Normal appearance.  HENT:     Mouth/Throat:     Mouth: Mucous membranes are moist.  Eyes:     General: No scleral icterus.    Conjunctiva/sclera: Conjunctivae normal.  Neck:     Thyroid: No thyroid mass, thyromegaly or thyroid tenderness.  Cardiovascular:     Rate and Rhythm: Normal rate and regular rhythm.     Heart sounds: No  murmur heard.    No friction rub. No gallop.  Pulmonary:     Effort: Pulmonary effort is normal.     Breath sounds: No stridor. No wheezing, rhonchi or rales.  Abdominal:     Palpations: There is no mass.     Tenderness: There is no abdominal tenderness. There is no guarding.     Hernia: No hernia is present.  Musculoskeletal:        General: Normal range of motion.     Cervical back: No tenderness.     Right lower leg: No edema.     Left lower leg: No edema.  Lymphadenopathy:     Cervical: No cervical adenopathy.  Skin:    General: Skin is warm and dry.  Neurological:     Mental Status: She is alert.  Psychiatric:        Mood and Affect: Mood normal.        Behavior: Behavior normal.      Lab Results  Component Value Date   WBC 6.4 08/29/2023   HGB 13.9 08/29/2023   HCT 42.5 08/29/2023   PLT 395.0 08/29/2023   GLUCOSE 79 06/17/2022   CHOL 250 (H) 08/29/2023   TRIG 116.0 08/29/2023   HDL 61.80 08/29/2023   LDLDIRECT 135.0 06/17/2022   LDLCALC 165 (H) 08/29/2023   ALT 11 08/29/2023   AST 17 08/29/2023   NA 139 06/17/2022   K 4.0 06/17/2022   CL 104 06/17/2022   CREATININE 0.9 06/06/2023   BUN 17 06/06/2023   CO2 29 06/17/2022   TSH 1.89 04/25/2023   HGBA1C 5.5 06/06/2023    MR SHOULDER LEFT WO CONTRAST Result Date: 10/26/2023 CLINICAL DATA:  Left shoulder pain and weakness. Remote pulling injury. EXAM: MRI OF THE LEFT SHOULDER WITHOUT CONTRAST TECHNIQUE: Multiplanar, multisequence MR imaging of the shoulder was performed. No intravenous contrast was administered. COMPARISON:  Radiographs 08/29/2023 FINDINGS: Rotator cuff: Mild thinning of the supraspinatus and infraspinatus tendons without a well-defined tear. Muscles:  Unremarkable Biceps long head:  Unremarkable Acromioclavicular Joint: Mild spurring and endosteal edema along the [facility] joint compatible with mild AC joint arthropathy. Type II acromion. Physiologic fluid in the subacromial subdeltoid bursa. Glenohumeral Joint: Advanced osteoarthritis with severe loss of articular cartilage, prominent spurring of the humeral head moderate spurring of the glenoid, and confluent degenerative subcortical cyst formation along the central and inferior glenoid. Small joint effusion. Labrum: Suspected degeneration of the inferior labrum but no discrete tear is identified. Bones: No significant extra-articular osseous abnormalities identified. Other: No supplemental non-categorized findings. IMPRESSION: 1. Advanced glenohumeral osteoarthritis. 2. Mild thinning of the supraspinatus and infraspinatus tendons without a well-defined tear. 3. Mild AC joint arthropathy. Electronically Signed   By: Gaylyn Rong M.D.   On:  10/26/2023 15:45    Assessment & Plan:  Acquired hypothyroidism -     CBC with Differential/Platelet; Future -     Basic metabolic panel with GFR; Future -     TSH; Future  Gastroesophageal reflux disease without esophagitis -     CBC with Differential/Platelet; Future -     Basic metabolic panel with GFR; Future     Follow-up: Return in about 6 months (around 08/20/2024).  Sanda Linger, MD

## 2024-04-10 DIAGNOSIS — Z1231 Encounter for screening mammogram for malignant neoplasm of breast: Secondary | ICD-10-CM | POA: Diagnosis not present

## 2024-04-10 DIAGNOSIS — D123 Benign neoplasm of transverse colon: Secondary | ICD-10-CM | POA: Diagnosis not present

## 2024-04-10 DIAGNOSIS — Z01419 Encounter for gynecological examination (general) (routine) without abnormal findings: Secondary | ICD-10-CM | POA: Diagnosis not present

## 2024-04-10 DIAGNOSIS — K635 Polyp of colon: Secondary | ICD-10-CM | POA: Diagnosis not present

## 2024-04-10 DIAGNOSIS — Z1211 Encounter for screening for malignant neoplasm of colon: Secondary | ICD-10-CM | POA: Diagnosis not present

## 2024-04-10 DIAGNOSIS — K648 Other hemorrhoids: Secondary | ICD-10-CM | POA: Diagnosis not present

## 2024-04-10 DIAGNOSIS — D128 Benign neoplasm of rectum: Secondary | ICD-10-CM | POA: Diagnosis not present

## 2024-04-10 DIAGNOSIS — Z860101 Personal history of adenomatous and serrated colon polyps: Secondary | ICD-10-CM | POA: Diagnosis not present

## 2024-04-10 DIAGNOSIS — Z1331 Encounter for screening for depression: Secondary | ICD-10-CM | POA: Diagnosis not present

## 2024-04-10 DIAGNOSIS — Z124 Encounter for screening for malignant neoplasm of cervix: Secondary | ICD-10-CM | POA: Diagnosis not present

## 2024-04-10 DIAGNOSIS — Z01411 Encounter for gynecological examination (general) (routine) with abnormal findings: Secondary | ICD-10-CM | POA: Diagnosis not present

## 2024-04-10 DIAGNOSIS — K621 Rectal polyp: Secondary | ICD-10-CM | POA: Diagnosis not present

## 2024-04-10 DIAGNOSIS — Z860100 Personal history of colon polyps, unspecified: Secondary | ICD-10-CM | POA: Diagnosis not present

## 2024-07-22 ENCOUNTER — Other Ambulatory Visit: Payer: Self-pay | Admitting: Family

## 2024-07-22 ENCOUNTER — Telehealth: Payer: Self-pay

## 2024-07-22 MED ORDER — COVID-19 MRNA VAC-TRIS(PFIZER) 30 MCG/0.3ML IM SUSY: 0.3000 mL | PREFILLED_SYRINGE | Freq: Once | INTRAMUSCULAR | 0 refills | Status: AC

## 2024-07-22 NOTE — Telephone Encounter (Signed)
 Copied from CRM [id number]. Topic: General - Other >> Jul 22, 2024 11:17 AM Revonda D wrote: Reason for CRM: Pt is requesting a prescription for a Covid vaccine and would like a callback with an update.

## 2024-07-22 NOTE — Telephone Encounter (Signed)
 Can you send the script ?

## 2024-08-26 ENCOUNTER — Telehealth: Payer: Self-pay

## 2024-08-26 ENCOUNTER — Other Ambulatory Visit (HOSPITAL_COMMUNITY): Payer: Self-pay

## 2024-08-26 ENCOUNTER — Ambulatory Visit: Payer: Self-pay | Admitting: Internal Medicine

## 2024-08-26 ENCOUNTER — Encounter: Payer: Self-pay | Admitting: Internal Medicine

## 2024-08-26 ENCOUNTER — Ambulatory Visit: Admitting: Internal Medicine

## 2024-08-26 VITALS — BP 126/66 | HR 67 | Temp 98.6°F | Resp 18 | Ht 65.0 in | Wt 189.6 lb

## 2024-08-26 DIAGNOSIS — R350 Frequency of micturition: Secondary | ICD-10-CM

## 2024-08-26 DIAGNOSIS — Z0000 Encounter for general adult medical examination without abnormal findings: Secondary | ICD-10-CM | POA: Diagnosis not present

## 2024-08-26 DIAGNOSIS — K5904 Chronic idiopathic constipation: Secondary | ICD-10-CM

## 2024-08-26 DIAGNOSIS — E785 Hyperlipidemia, unspecified: Secondary | ICD-10-CM | POA: Diagnosis not present

## 2024-08-26 DIAGNOSIS — Z0001 Encounter for general adult medical examination with abnormal findings: Secondary | ICD-10-CM

## 2024-08-26 DIAGNOSIS — Z23 Encounter for immunization: Secondary | ICD-10-CM | POA: Diagnosis not present

## 2024-08-26 DIAGNOSIS — K219 Gastro-esophageal reflux disease without esophagitis: Secondary | ICD-10-CM | POA: Diagnosis not present

## 2024-08-26 DIAGNOSIS — E039 Hypothyroidism, unspecified: Secondary | ICD-10-CM

## 2024-08-26 DIAGNOSIS — R9431 Abnormal electrocardiogram [ECG] [EKG]: Secondary | ICD-10-CM | POA: Diagnosis not present

## 2024-08-26 LAB — HEPATIC FUNCTION PANEL
ALT: 12 U/L (ref 0–35)
AST: 15 U/L (ref 0–37)
Albumin: 4.6 g/dL (ref 3.5–5.2)
Alkaline Phosphatase: 75 U/L (ref 39–117)
Bilirubin, Direct: 0.1 mg/dL (ref 0.0–0.3)
Total Bilirubin: 0.6 mg/dL (ref 0.2–1.2)
Total Protein: 7.4 g/dL (ref 6.0–8.3)

## 2024-08-26 LAB — TSH: TSH: 2.54 u[IU]/mL (ref 0.35–5.50)

## 2024-08-26 LAB — CBC WITH DIFFERENTIAL/PLATELET
Basophils Absolute: 0 K/uL (ref 0.0–0.1)
Basophils Relative: 0.4 % (ref 0.0–3.0)
Eosinophils Absolute: 0.1 K/uL (ref 0.0–0.7)
Eosinophils Relative: 1.7 % (ref 0.0–5.0)
HCT: 40.9 % (ref 36.0–46.0)
Hemoglobin: 13.5 g/dL (ref 12.0–15.0)
Lymphocytes Relative: 40.6 % (ref 12.0–46.0)
Lymphs Abs: 3.2 K/uL (ref 0.7–4.0)
MCHC: 33.1 g/dL (ref 30.0–36.0)
MCV: 88.2 fl (ref 78.0–100.0)
Monocytes Absolute: 0.5 K/uL (ref 0.1–1.0)
Monocytes Relative: 6 % (ref 3.0–12.0)
Neutro Abs: 4 K/uL (ref 1.4–7.7)
Neutrophils Relative %: 51.3 % (ref 43.0–77.0)
Platelets: 377 K/uL (ref 150.0–400.0)
RBC: 4.64 Mil/uL (ref 3.87–5.11)
RDW: 13.7 % (ref 11.5–15.5)
WBC: 7.8 K/uL (ref 4.0–10.5)

## 2024-08-26 LAB — LIPID PANEL
Cholesterol: 267 mg/dL — ABNORMAL HIGH (ref 0–200)
HDL: 62.7 mg/dL (ref >39.00)
LDL Cholesterol: 156 mg/dL — ABNORMAL HIGH (ref 0–99)
NonHDL: 204.22
Total CHOL/HDL Ratio: 4
Triglycerides: 242 mg/dL — ABNORMAL HIGH (ref 0.0–149.0)
VLDL: 48.4 mg/dL — ABNORMAL HIGH (ref 0.0–40.0)

## 2024-08-26 LAB — URINALYSIS, ROUTINE W REFLEX MICROSCOPIC
Bilirubin Urine: NEGATIVE
Hgb urine dipstick: NEGATIVE
Ketones, ur: NEGATIVE
Nitrite: NEGATIVE
Specific Gravity, Urine: 1.01 (ref 1.000–1.030)
Total Protein, Urine: NEGATIVE
Urine Glucose: NEGATIVE
Urobilinogen, UA: 0.2 (ref 0.0–1.0)
pH: 6 (ref 5.0–8.0)

## 2024-08-26 LAB — BASIC METABOLIC PANEL WITH GFR
BUN: 16 mg/dL (ref 6–23)
CO2: 29 meq/L (ref 19–32)
Calcium: 9.4 mg/dL (ref 8.4–10.5)
Chloride: 103 meq/L (ref 96–112)
Creatinine, Ser: 0.9 mg/dL (ref 0.40–1.20)
GFR: 65.34 mL/min (ref >60.00)
Glucose, Bld: 100 mg/dL — ABNORMAL HIGH (ref 70–99)
Potassium: 3.8 meq/L (ref 3.5–5.1)
Sodium: 139 meq/L (ref 135–145)

## 2024-08-26 LAB — MAGNESIUM: Magnesium: 2 mg/dL (ref 1.5–2.5)

## 2024-08-26 LAB — HEMOGLOBIN A1C: Hgb A1c MFr Bld: 5.7 % (ref 4.6–6.5)

## 2024-08-26 MED ORDER — COVID-19 MRNA VAC-TRIS(PFIZER) 30 MCG/0.3ML IM SUSY: 0.3000 mL | PREFILLED_SYRINGE | Freq: Once | INTRAMUSCULAR | 0 refills | Status: AC

## 2024-08-26 MED ORDER — SHINGRIX 50 MCG/0.5ML IM SUSR: 0.5000 mL | Freq: Once | INTRAMUSCULAR | 1 refills | Status: AC

## 2024-08-26 MED ORDER — VOQUEZNA 10 MG PO TABS: 1.0000 | ORAL_TABLET | Freq: Every day | ORAL | Status: DC

## 2024-08-26 MED ORDER — VOQUEZNA 10 MG PO TABS: 1.0000 | ORAL_TABLET | Freq: Every day | ORAL | 1 refills | Status: DC

## 2024-08-26 MED ORDER — NADOLOL 20 MG PO TABS
20.0000 mg | ORAL_TABLET | Freq: Every day | ORAL | 0 refills | Status: DC
Start: 2024-08-26 — End: 2024-09-04

## 2024-08-26 NOTE — Progress Notes (Signed)
 Subjective:  Patient ID: Toni Ramirez, female    DOB: [birth year]  Age: 68 y.o. MRN: [id number]  CC: Annual Exam, Hyperlipidemia, Hypothyroidism, and Gastroesophageal Reflux   HPI Toni Ramirez presents for a CPX and f/up --  Discussed the use of AI scribe software for clinical note transcription with the patient, who gave verbal consent to proceed.  History of Present Illness Toni Ramirez is a 68 year old female who presents for a follow-up regarding an irregular urine analysis and routine health maintenance.  She noticed an irregularity in her urine analysis on her MyChart. She has no urinary symptoms such as dysuria or hematuria but experiences frequent urination, which she attributes to increased fluid intake.  She has a lifelong history of constipation, typically having bowel movements two to three times a week without bloating or pain. Occasionally, she experiences straining or cramping but denies regular hematochezia, though she has hemorrhoids. She manages her constipation with magnesium , fruit, and exercise and does not feel the need for prescription medication.  Recently, she experienced a cold with coughing and congestion lasting two to three weeks. She questions whether her symptoms might be related to indigestion or reflux, though she has never been treated for reflux before. She has no chest pain but occasionally has coughing fits.  She underwent a colonoscopy in the summer, which revealed a few polyps, and she is scheduled for a follow-up in three to five years. Her last mammogram was in the winter or spring of 2025.  She denies symptoms of thyroid  dysfunction such as weight changes, fatigue, or significant constipation beyond her baseline.     Outpatient Medications Prior to Visit  Medication Sig Dispense Refill   ALPRAZolam  (XANAX ) 0.5 MG tablet Take 1 tablet (0.5 mg total) by mouth at bedtime as needed for anxiety. (Patient taking differently: Take 0.5 mg by  mouth at bedtime as needed for anxiety. Patient only uses this when she's FLYING.) 30 tablet 1   SYNTHROID  112 MCG tablet Take 1 tablet (112 mcg total) by mouth daily. 90 tablet 1   No facility-administered medications prior to visit.    ROS Review of Systems  Constitutional:  Negative for appetite change, chills, diaphoresis, fatigue and fever.  HENT: Negative.  Negative for sore throat and trouble swallowing.   Eyes: Negative.   Respiratory: Negative.  Negative for cough, chest tightness, shortness of breath and wheezing.   Cardiovascular:  Negative for chest pain, palpitations and leg swelling.  Gastrointestinal:  Positive for constipation. Negative for abdominal pain, blood in stool, diarrhea, nausea and vomiting.  Endocrine: Negative.   Genitourinary:  Positive for frequency.  Musculoskeletal: Negative.  Negative for arthralgias and myalgias.  Skin: Negative.   Neurological: Negative.  Negative for dizziness, weakness, light-headedness and headaches.  Hematological:  Negative for adenopathy. Does not bruise/bleed easily.    Objective:  BP 126/66 (BP Location: Left Arm, Patient Position: Sitting, Cuff Size: Normal)   Pulse 67   Temp 98.6 F (37 C) (Temporal)   Resp 18   Ht 5' 5 (1.651 m)   Wt 189 lb 9.6 oz (86 kg)   LMP 06/14/2010   SpO2 98%   BMI 31.55 kg/m   BP Readings from Last 3 Encounters:  08/26/24 126/66  02/19/24 128/76  01/17/24 120/70    Wt Readings from Last 3 Encounters:  08/26/24 189 lb 9.6 oz (86 kg)  02/19/24 185 lb 12.8 oz (84.3 kg)  01/17/24 185 lb 3.2 oz (84 kg)  Physical Exam Vitals reviewed.  Constitutional:      Appearance: Normal appearance.  HENT:     Mouth/Throat:     Mouth: Mucous membranes are moist.  Eyes:     General: No scleral icterus.    Conjunctiva/sclera: Conjunctivae normal.  Cardiovascular:     Rate and Rhythm: Normal rate and regular rhythm.     Heart sounds: No murmur heard.    No friction rub. No gallop.      Comments: EKG---- NSR, 66 bpm Prolonged QT - new Anterior infarct pattern is not new No LVH   Pulmonary:     Effort: Pulmonary effort is normal.     Breath sounds: No stridor. No wheezing, rhonchi or rales.  Abdominal:     General: Abdomen is flat.     Palpations: There is no mass.     Tenderness: There is no abdominal tenderness. There is no guarding.     Hernia: No hernia is present.  Musculoskeletal:        General: Normal range of motion.     Cervical back: Neck supple.     Right lower leg: No edema.     Left lower leg: No edema.  Lymphadenopathy:     Cervical: No cervical adenopathy.  Skin:    General: Skin is warm and dry.  Neurological:     General: No focal deficit present.     Mental Status: She is alert.  Psychiatric:        Mood and Affect: Mood normal.        Behavior: Behavior normal.     Lab Results  Component Value Date   WBC 7.8 08/26/2024   HGB 13.5 08/26/2024   HCT 40.9 08/26/2024   PLT 377.0 08/26/2024   GLUCOSE 100 (H) 08/26/2024   CHOL 267 (H) 08/26/2024   TRIG 242.0 (H) 08/26/2024   HDL 62.70 08/26/2024   LDLDIRECT 135.0 06/17/2022   LDLCALC 156 (H) 08/26/2024   ALT 12 08/26/2024   AST 15 08/26/2024   NA 139 08/26/2024   K 3.8 08/26/2024   CL 103 08/26/2024   CREATININE 0.90 08/26/2024   BUN 16 08/26/2024   CO2 29 08/26/2024   TSH 2.54 08/26/2024   HGBA1C 5.7 08/26/2024    MR SHOULDER LEFT WO CONTRAST Result Date: 10/26/2023 CLINICAL DATA:  Left shoulder pain and weakness. Remote pulling injury. EXAM: MRI OF THE LEFT SHOULDER WITHOUT CONTRAST TECHNIQUE: Multiplanar, multisequence MR imaging of the shoulder was performed. No intravenous contrast was administered. COMPARISON:  Radiographs 08/29/2023 FINDINGS: Rotator cuff: Mild thinning of the supraspinatus and infraspinatus tendons without a well-defined tear. Muscles:  Unremarkable Biceps long head:  Unremarkable Acromioclavicular Joint: Mild spurring and endosteal edema along the [facility]  joint compatible with mild AC joint arthropathy. Type II acromion. Physiologic fluid in the subacromial subdeltoid bursa. Glenohumeral Joint: Advanced osteoarthritis with severe loss of articular cartilage, prominent spurring of the humeral head moderate spurring of the glenoid, and confluent degenerative subcortical cyst formation along the central and inferior glenoid. Small joint effusion. Labrum: Suspected degeneration of the inferior labrum but no discrete tear is identified. Bones: No significant extra-articular osseous abnormalities identified. Other: No supplemental non-categorized findings. IMPRESSION: 1. Advanced glenohumeral osteoarthritis. 2. Mild thinning of the supraspinatus and infraspinatus tendons without a well-defined tear. 3. Mild AC joint arthropathy. Electronically Signed   By: Ryan Salvage M.D.   On: 10/26/2023 15:45    Assessment & Plan:  Immunization due -     Flu vaccine HIGH  DOSE PF(Fluzone Trivalent)  Acquired hypothyroidism- She is euthyroid. -     TSH; Future  Gastroesophageal reflux disease without esophagitis -     CBC with Differential/Platelet; Future -     Voquezna; Take 1 tablet by mouth daily.  Dispense: 90 tablet; Refill: 1 -     Voquezna; Take 1 tablet by mouth daily.  Need for prophylactic vaccination and inoculation against varicella -     Shingrix ; Inject 0.5 mLs into the muscle once for 1 dose.  Dispense: 0.5 mL; Refill: 1  Chronic idiopathic constipation -     Basic metabolic panel with GFR; Future -     Magnesium ; Future  Urine frequency -     Urinalysis, Routine w reflex microscopic; Future -     Hemoglobin A1c; Future -     CULTURE, URINE COMPREHENSIVE; Future  Hyperlipidemia with target LDL less than 130 -     Lipid panel; Future -     Hepatic function panel; Future  Prolonged Q-T interval on ECG -     Ambulatory referral to Cardiology -     Nadolol; Take 1 tablet (20 mg total) by mouth daily.  Dispense: 30 tablet; Refill:  0  Other orders -     COVID-19 mRNA Vac-TriS(Pfizer); Inject 0.3 mLs into the muscle once for 1 dose.  Dispense: 0.3 mL; Refill: 0     Follow-up: Return in about 6 months (around 02/24/2025).  Debby Molt, MD

## 2024-08-26 NOTE — Patient Instructions (Signed)
 Heartbeat Timing Changes (Long QT Syndrome): What to Know  Long QT syndrome (LQTS) happens when your heart takes longer than normal to be ready to beat again. With LQTS, the timing of your heartbeats, heart rate, and rhythm can change. This can lead to a heart attack. What are the causes? The cause depends on the type of LQTS that you have. Types include: Inherited LQTS. You're born with this. It's caused by a gene that's passed down through your family. Acquired LQTS. You get this later in life. It may be caused by: Certain medicines, such as antibiotics or medicines to treat depression. Changes in electrolytes, which are salts and minerals in your body. Heart disease, such as heart failure. Thyroid  gland problems. What increases the risk? You may be more at risk if: You're female. You have an eating disorder. You throw up or have liquid poop for a long period of time. Someone else in your family has LQTS. Your or someone else in your family has a history of: Fainting with no known cause. Drowning. Sudden death. What are the signs or symptoms? Symptoms may include: Fainting. Fast or uneven heartbeats. Seizures. Some people have no symptoms. Symptoms of inherited LQTS almost always start before age 63. How is this diagnosed? You may be diagnosed based on your health history and an exam. You may also have tests, such as: Blood tests to look for: Genes that cause LQTS. Changes in electrolytes. An electrocardiogram (ECG). This test records the electrical signals of your heart. A test in which you wear a device to record your heart's electrical signals while at home. An exercise stress test. This looks for signs of blocked arteries when the heart is stressed. How is this treated? There's no cure for LQTS, but treatment can help with symptoms. Treatment depends on the cause and if you have a family history of sudden death. You may be told to: Make changes to your daily life. Avoid  certain sports. Take supplements. Take heart medicines. Have an implantable cardioverter-defibrillator (ICD) put in. This device senses your heartbeat. It shocks your heart back to a normal heart rate as needed. Follow these instructions at home: Medicines Take your medicines only as told. Talk with your health care provider before you take any new medicines or supplements. Lifestyle You may need to avoid: Doing things or going places that may cause stress. Sudden loud noises. Swimming and diving. If you drink alcohol : Limit how much you have to: 0-1 drink a day if you're female. 0-2 drinks a day if you're female. Know how much alcohol  is in your drink. In the [place] one drink is one 12 oz bottle of beer (355 mL), one 5 oz glass of wine (148 mL), or one 1 oz glass of hard liquor (44 mL). Do not smoke, vape, or use nicotine or tobacco. General instructions Tell the people you live with about signs of sudden heartbeat changes. Wear an alert bracelet or necklace that says you have LQTS and gives your contact info. Get treatment and support if you feel afraid, worried, or stressed. Keep all follow-up visits. Your treatment plan may need to change over time. Contact a health care provider if: You have stress. You feel worried, sad, or hopeless. You have a long period of throwing up. You have liquid poop. Get help right away if: You have chest pain. You have trouble breathing. You have fast or uneven heartbeats. You faint. You have a seizure. These symptoms may be an emergency. Call 911  right away. Do not wait to see if the symptoms will go away. Do not drive yourself to the hospital. This information is not intended to replace advice given to you by your health care provider. Make sure you discuss any questions you have with your health care provider. Document Revised: 01/19/2024 Document Reviewed: 01/15/2024 Elsevier Patient Education  2025 [employer]Health Maintenance,  Female Adopting a healthy lifestyle and getting preventive care are important in promoting health and wellness. Ask your health care provider about: The right schedule for you to have regular tests and exams. Things you can do on your own to prevent diseases and keep yourself healthy. What should I know about diet, weight, and exercise? Eat a healthy diet  Eat a diet that includes plenty of vegetables, fruits, low-fat dairy products, and lean protein. Do not eat a lot of foods that are high in solid fats, added sugars, or sodium. Maintain a healthy weight Body mass index (BMI) is used to identify weight problems. It estimates body fat based on height and weight. Your health care provider can help determine your BMI and help you achieve or maintain a healthy weight. Get regular exercise Get regular exercise. This is one of the most important things you can do for your health. Most adults should: Exercise for at least 150 minutes each week. The exercise should increase your heart rate and make you sweat (moderate-intensity exercise). Do strengthening exercises at least twice a week. This is in addition to the moderate-intensity exercise. Spend less time sitting. Even light physical activity can be beneficial. Watch cholesterol and blood lipids Have your blood tested for lipids and cholesterol at 69 years of age, then have this test every 5 years. Have your cholesterol levels checked more often if: Your lipid or cholesterol levels are high. You are older than 70 years of age. You are at high risk for heart disease. What should I know about cancer screening? Depending on your health history and family history, you may need to have cancer screening at various ages. This may include screening for: Breast cancer. Cervical cancer. Colorectal cancer. Skin cancer. Lung cancer. What should I know about heart disease, diabetes, and high blood pressure? Blood pressure and heart disease High blood  pressure causes heart disease and increases the risk of stroke. This is more likely to develop in people who have high blood pressure readings or are overweight. Have your blood pressure checked: Every 3-5 years if you are 78-81 years of age. Every year if you are 38 years old or older. Diabetes Have regular diabetes screenings. This checks your fasting blood sugar level. Have the screening done: Once every three years after age 76 if you are at a normal weight and have a low risk for diabetes. More often and at a younger age if you are overweight or have a high risk for diabetes. What should I know about preventing infection? Hepatitis B If you have a higher risk for hepatitis B, you should be screened for this virus. Talk with your health care provider to find out if you are at risk for hepatitis B infection. Hepatitis C Testing is recommended for: Everyone born from [birth year] through 1966. Anyone with known risk factors for hepatitis C. Sexually transmitted infections (STIs) Get screened for STIs, including gonorrhea and chlamydia, if: You are sexually active and are younger than 69 years of age. You are older than 69 years of age and your health care provider tells you that you are at risk  for this type of infection. Your sexual activity has changed since you were last screened, and you are at increased risk for chlamydia or gonorrhea. Ask your health care provider if you are at risk. Ask your health care provider about whether you are at high risk for HIV. Your health care provider may recommend a prescription medicine to help prevent HIV infection. If you choose to take medicine to prevent HIV, you should first get tested for HIV. You should then be tested every 3 months for as long as you are taking the medicine. Pregnancy If you are about to stop having your period (premenopausal) and you may become pregnant, seek counseling before you get pregnant. Take 400 to 800 micrograms (mcg) of folic  acid every day if you become pregnant. Ask for birth control (contraception) if you want to prevent pregnancy. Osteoporosis and menopause Osteoporosis is a disease in which the bones lose minerals and strength with aging. This can result in bone fractures. If you are 37 years old or older, or if you are at risk for osteoporosis and fractures, ask your health care provider if you should: Be screened for bone loss. Take a calcium  or vitamin D  supplement to lower your risk of fractures. Be given hormone replacement therapy (HRT) to treat symptoms of menopause. Follow these instructions at home: Alcohol  use Do not drink alcohol  if: Your health care provider tells you not to drink. You are pregnant, may be pregnant, or are planning to become pregnant. If you drink alcohol : Limit how much you have to: 0-1 drink a day. Know how much alcohol  is in your drink. In the [place] one drink equals one 12 oz bottle of beer (355 mL), one 5 oz glass of wine (148 mL), or one 1 oz glass of hard liquor (44 mL). Lifestyle Do not use any products that contain nicotine or tobacco. These products include cigarettes, chewing tobacco, and vaping devices, such as e-cigarettes. If you need help quitting, ask your health care provider. Do not use street drugs. Do not share needles. Ask your health care provider for help if you need support or information about quitting drugs. General instructions Schedule regular health, dental, and eye exams. Stay current with your vaccines. Tell your health care provider if: You often feel depressed. You have ever been abused or do not feel safe at home. Summary Adopting a healthy lifestyle and getting preventive care are important in promoting health and wellness. Follow your health care provider's instructions about healthy diet, exercising, and getting tested or screened for diseases. Follow your health care provider's instructions on monitoring your cholesterol and blood  pressure. This information is not intended to replace advice given to you by your health care provider. Make sure you discuss any questions you have with your health care provider. Document Revised: 03/22/2021 Document Reviewed: 03/22/2021 Elsevier Patient Education  2024 [employer].

## 2024-08-26 NOTE — Telephone Encounter (Signed)
 Pharmacy Patient Advocate Encounter   Received notification from CoverMyMeds that prior authorization for Voquezna 10MG  tablets is required/requested.   Insurance verification completed.   The patient is insured through [employer].   Per test claim: PA required; PA started via CoverMyMeds. KEY [id number] . Please see clinical question(s) below that I am not finding the answer to in their chart and advise.  For GERD, does the patient have documentation of prior therapy, intolerance, or contraindication to at least two proton pump inhibitors (e.g., omeprazole, pantoprazole)?

## 2024-08-28 DIAGNOSIS — C73 Malignant neoplasm of thyroid gland: Secondary | ICD-10-CM | POA: Diagnosis not present

## 2024-08-28 DIAGNOSIS — R635 Abnormal weight gain: Secondary | ICD-10-CM | POA: Diagnosis not present

## 2024-08-28 DIAGNOSIS — E890 Postprocedural hypothyroidism: Secondary | ICD-10-CM | POA: Diagnosis not present

## 2024-08-28 LAB — CULTURE, URINE COMPREHENSIVE

## 2024-08-28 NOTE — Assessment & Plan Note (Addendum)
Exam completed, labs reviewed, vaccines reviewed and updated, cancer screenings are UTD, pt ed material was given.

## 2024-09-02 DIAGNOSIS — I209 Angina pectoris, unspecified: Secondary | ICD-10-CM | POA: Insufficient documentation

## 2024-09-02 DIAGNOSIS — C801 Malignant (primary) neoplasm, unspecified: Secondary | ICD-10-CM | POA: Insufficient documentation

## 2024-09-02 DIAGNOSIS — M1990 Unspecified osteoarthritis, unspecified site: Secondary | ICD-10-CM | POA: Insufficient documentation

## 2024-09-03 DIAGNOSIS — E890 Postprocedural hypothyroidism: Secondary | ICD-10-CM | POA: Diagnosis not present

## 2024-09-03 DIAGNOSIS — C73 Malignant neoplasm of thyroid gland: Secondary | ICD-10-CM | POA: Diagnosis not present

## 2024-09-04 ENCOUNTER — Ambulatory Visit

## 2024-09-04 VITALS — BP 122/86 | HR 63 | Ht 65.0 in | Wt 185.0 lb

## 2024-09-04 DIAGNOSIS — Z9189 Other specified personal risk factors, not elsewhere classified: Secondary | ICD-10-CM | POA: Insufficient documentation

## 2024-09-04 DIAGNOSIS — E785 Hyperlipidemia, unspecified: Secondary | ICD-10-CM | POA: Diagnosis not present

## 2024-09-04 DIAGNOSIS — R9431 Abnormal electrocardiogram [ECG] [EKG]: Secondary | ICD-10-CM | POA: Diagnosis not present

## 2024-09-04 NOTE — Assessment & Plan Note (Signed)
 Elevated total cholesterol, LDL and triglyceride levels. 10-year ASCVD score risk calculated 8.9%.  In the setting would be a candidate for statins for risk reduction.  She is currently not keen on starting a medication. Discussed further risk assessment of cardiovascular disease with calcium  score study and if calcium  score elevated would make a strong case to be started on a statin therapy for overall cardiovascular risk reduction in addition to cholesterol management.  She is agreeable to proceed with this. Will request a calcium  score study, to be done in [place].

## 2024-09-04 NOTE — Progress Notes (Signed)
 Cardiology Consultation:    Date:  09/04/2024   ID:  Toni Ramirez, Toni Ramirez 04/07/1956, MRN [id number]  PCP:  Joshua Debby CROME, MD  Cardiologist:  Alean SAUNDERS Kyley Laurel, MD   Referring MD: Joshua Debby CROME, MD   No chief complaint on file.    ASSESSMENT AND PLAN:   Ms. Yarbro 68 year old woman with history of hypothyroidism well-controlled on Synthroid , obesity, hyperlipidemia.  She also takes magnesium , calcium  plus vitamin D  supplements.  Drinks black coffee.  Occasional social alcohol  consumption.  Seen for further evaluation of prolonged QT noted at PCPs office. EKG here in the office appears reassuring with borderline prolonged QT corrected 485 ms.   Problem List Items Addressed This Visit     Hyperlipidemia with target LDL less than 130   Elevated total cholesterol, LDL and triglyceride levels. 10-year ASCVD score risk calculated 8.9%.  In the setting would be a candidate for statins for risk reduction.  She is currently not keen on starting a medication. Discussed further risk assessment of cardiovascular disease with calcium  score study and if calcium  score elevated would make a strong case to be started on a statin therapy for overall cardiovascular risk reduction in addition to cholesterol management.  She is agreeable to proceed with this. Will request a calcium  score study, to be done in [place].      Abnormal QT interval present on electrocardiogram - Primary   Abnormal QT interval recorded at PCPs office visit on EKG. EKG repeated here borderline prolonged QT corrected 485 ms.  Given no significant cardiac history, borderline numbers, likely normal.  No significant electrolyte abnormalities on recent blood work.   She is currently not on any QT prolonging medications.  Will rule out structural heart disease with transthoracic echocardiogram. Will obtain treadmill EKG stress to assess functional capacity and to rule out any arrhythmias.  Reassured her  about the findings.      Relevant Orders   EKG 12-Lead (Completed)   ECHOCARDIOGRAM COMPLETE   Exercise Tolerance Test   CT CARDIAC SCORING   Cardiac Stress Test: Informed Consent Details: Physician/Practitioner Attestation; Transcribe to consent form and obtain patient signature   At increased risk for cardiovascular disease   Given her age, hyperlipidemia, elevated risk for cardiovascular disease.  10-year ASCVD risk score calculated 8.9%.  Proceed with calcium  score study for further risk stratification.        Return to clinic tentatively in 3 months  History of Present Illness:    Toni Ramirez is a 68 y.o. female who is being seen today for the evaluation of prolonged QT interval on EKG done at PCPs office recently at the request of Joshua Debby CROME, MD.   Pleasant woman here for the visit by herself.  Lives in [place].  She denies any significant prior cardiac history. Has longstanding history of hypothyroidism well-controlled on Synthroid , obesity, hyperlipidemia.  She also takes magnesium , calcium  plus vitamin D  supplements.  Drinks black coffee.  Occasional social alcohol  consumption.  From cardiac standpoint she does not have any significant symptoms.  Brief atypical chest discomfort 2 or 3 episodes of major that occur randomly have been observed by her.  No symptoms of chest pain with activity or exercise. No shortness of breath. No orthopnea or paroxysmal nocturnal dyspnea.  No palpitations, lightheadedness, dizziness or syncopal episodes.  Weight tends to fluctuate she was as high as 205 pounds and below is 175 pounds.  Recently she has been losing weight with improved diet and activity.  Husband had history of sudden MI in his 27s and passed away from that.  She is concerned about heart disease related risk.  No  family history of sudden cardiac death.  Does not smoke. Rare social alcohol  consumption. No illicit drug use. Drinks coffee  occasionally.   EKG in clinic today shows sinus rhythm heart rate 63/min, PR interval normal 168 ms, QRS duration 76 ms, QTc 485 ms.  No ischemia.  In comparison prior EKG from PCPs office 08/26/2024 noted heart rate 66/min, PR interval 170 ms, QTc reported 501 ms, however Manually measured QT interval appears shorter for 440 ms denoting a normal QTc.  Abnormal lipid panel 08/26/2024 with elevated total cholesterol 267, triglycerides 242, LDL 156, HDL 62. Basic metabolic panel and CBC unremarkable. TSH normal 2.54 Hemoglobin A1c 5.7 Magnesium  2  Past Medical History:  Diagnosis Date   Acquired hypothyroidism 02/27/2017   Allergic rhinitis 03/22/2017   Anginal pain    because of anxiety- none significant episode   Anxiety    Arthritis    Cancer (HCC)    thyroid  cancer dx. 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   Flu vaccine need 08/21/2023   Gastroesophageal reflux disease without esophagitis 02/19/2024   Hyperlipidemia with target LDL less than 130 06/16/2022   The 10-year ASCVD risk score (Arnett DK, et al., 2019) is: 8.9%    Values used to calculate the score:      Age: 13 years      Clincally relevant sex: Female      Is Non-Hispanic African American: No      Diabetic: No      Tobacco smoker: No      Systolic Blood Pressure: 126 mmHg      Is BP treated: No      HDL Cholesterol: 62.7 mg/dL      Total Cholesterol: 267 mg/dL       Need for prophylactic vaccination and inoculation against varicella 08/26/2024   Need for vaccination 08/21/2023   Prolonged Q-T interval on ECG 08/26/2024   Screening mammogram for breast cancer 08/21/2023   Thyroid  cancer (HCC) 12/25/2013   Thyroid  disease    Urine frequency 08/26/2024    Past Surgical History:  Procedure Laterality Date    DILATION AND CURETTAGE OF UTERUS  1994, 2003   THYROID  LOBECTOMY Right 12/20/2013   Procedure: RIGHT THYROID  LOBECTOMY;  Surgeon: Alm VEAR Angle, MD;  Location: [facility];  Service: General;  Laterality: Right;   THYROIDECTOMY N/A 12/25/2013   Procedure: COMPLETION THYROIDECTOMY;  Surgeon: Alm VEAR Angle, MD;  Location: [facility];  Service: General;  Laterality: N/A;   TONSILLECTOMY      Current Medications: Current Meds  Medication Sig   levothyroxine  (SYNTHROID ) 100 MCG tablet Take 100 mcg by mouth daily before breakfast.     Allergies:   Patient has no known allergies.   Social History   Socioeconomic History   Marital status: Widowed    Spouse name: Not on file   Number of children: Not on file   Years of education: Not on file   Highest education level: Not on file  Occupational History   Not on file  Tobacco Use   Smoking status: Never   Smokeless tobacco: Never  Substance and Sexual Activity   Alcohol  use: Yes    Comment:  social- less 1 per week   Drug use: No   Sexual activity: Not Currently  Other Topics Concern   Not on file  Social History Narrative   Not on file   Social Drivers of Health   Financial Resource Strain: Not on file  Food Insecurity: Not on file  Transportation Needs: Not on file  Physical Activity: Not on file  Stress: Not on file  Social Connections: Not on file     Family History: The patient's family history includes ADD / ADHD in her son; Breast cancer in her mother; Cancer in her father and mother. There is no history of Alcohol  abuse, Depression, Diabetes, Arthritis, Heart disease, Hyperlipidemia, Hypertension, Kidney disease, or Stroke. ROS:   Please see the history of present illness.    All 14 point review of systems negative except as described per history of present illness.  EKGs/Labs/Other Studies Reviewed:    The following studies were reviewed today:   EKG:  EKG Interpretation Date/Time:  Wednesday September 04 2024 09:03:01  EDT Ventricular Rate:  63 PR Interval:  168 QRS Duration:  76 QT Interval:  474 QTC Calculation: 485 R Axis:   49  Text Interpretation: Normal sinus rhythm Low voltage QRS When compared with ECG of 16-Dec-2013 08:49, No significant change was found Confirmed by Liborio Hai reddy [phone]) on 09/04/2024 9:24:10 AM    Recent Labs: 08/26/2024: ALT 12; BUN 16; Creatinine, Ser 0.90; Hemoglobin 13.5; Magnesium  2.0; Platelets 377.0; Potassium 3.8; Sodium 139; TSH 2.54  Recent Lipid Panel    Component Value Date/Time   CHOL 267 (H) 08/26/2024 1359   TRIG 242.0 (H) 08/26/2024 1359   HDL 62.70 08/26/2024 1359   CHOLHDL 4 08/26/2024 1359   VLDL 48.4 (H) 08/26/2024 1359   LDLCALC 156 (H) 08/26/2024 1359   LDLDIRECT 135.0 06/17/2022 1354    Physical Exam:    VS:  BP 122/86   Pulse 63   Ht 5' 5 (1.651 m)   Wt 185 lb (83.9 kg)   LMP 06/14/2010   SpO2 99%   BMI 30.79 kg/m     Wt Readings from Last 3 Encounters:  09/04/24 185 lb (83.9 kg)  08/26/24 189 lb 9.6 oz (86 kg)  02/19/24 185 lb 12.8 oz (84.3 kg)     GENERAL:  Well nourished, well developed in no acute distress NECK: No JVD; No carotid bruits CARDIAC: RRR, S1 and S2 present, no murmurs, no rubs, no gallops CHEST:  Clear to auscultation without rales, wheezing or rhonchi  Extremities: No pitting pedal edema. Pulses bilaterally symmetric with radial 2+ and dorsalis pedis 2+ NEUROLOGIC:  Alert and oriented x 3  Medication Adjustments/Labs and Tests Ordered: Current medicines are reviewed at length with the patient today.  Concerns regarding medicines are outlined above.  Orders Placed This Encounter  Procedures   CT CARDIAC SCORING   Cardiac Stress Test: Informed Consent Details: Physician/Practitioner Attestation; Transcribe to consent form and obtain patient signature   Exercise Tolerance Test   EKG 12-Lead   ECHOCARDIOGRAM COMPLETE   No orders of the defined types were placed in this  encounter.   Signed, Hai jess Liborio, MD, MPH, [facility]. 09/04/2024 10:09 AM    [facility]

## 2024-09-04 NOTE — Assessment & Plan Note (Signed)
 Abnormal QT interval recorded at PCPs office visit on EKG. EKG repeated here borderline prolonged QT corrected 485 ms.  Given no significant cardiac history, borderline numbers, likely normal.  No significant electrolyte abnormalities on recent blood work.   She is currently not on any QT prolonging medications.  Will rule out structural heart disease with transthoracic echocardiogram. Will obtain treadmill EKG stress to assess functional capacity and to rule out any arrhythmias.  Reassured her about the findings.

## 2024-09-04 NOTE — Assessment & Plan Note (Signed)
 Given her age, hyperlipidemia, elevated risk for cardiovascular disease.  10-year ASCVD risk score calculated 8.9%.  Proceed with calcium  score study for further risk stratification.

## 2024-09-04 NOTE — Patient Instructions (Signed)
 Medication Instructions:  Your physician recommends that you continue on your current medications as directed. Please refer to the Current Medication list given to you today.  *If you need a refill on your cardiac medications before your next appointment, please call your pharmacy*  Lab Work: None If you have labs (blood work) drawn today and your tests are completely normal, you will receive your results only by: MyChart Message (if you have MyChart) OR A paper copy in the mail If you have any lab test that is abnormal or we need to change your treatment, we will call you to review the results.  Testing/Procedures: Your physician has requested that you have an echocardiogram. Echocardiography is a painless test that uses sound waves to create images of your heart. It provides your doctor with information about the size and shape of your heart and how well your heart's chambers and valves are working. This procedure takes approximately one hour. There are no restrictions for this procedure. Please do NOT wear cologne, perfume, aftershave, or lotions (deodorant is allowed). Please arrive 15 minutes prior to your appointment time.  Please note: We ask at that you not bring children with you during ultrasound (echo/ vascular) testing. Due to room size and safety concerns, children are not allowed in the ultrasound rooms during exams. Our front office staff cannot provide observation of children in our lobby area while testing is being conducted. An adult accompanying a patient to their appointment will only be allowed in the ultrasound room at the discretion of the ultrasound technician under special circumstances. We apologize for any inconvenience.  Treadmill Stress Test Instructions:    1. You may take all of your medications.  2. No food, drink or tobacco products 2 hours prior to your test.  3. Dress prepared to exercise. Best to wear 2 piece outfit and tennis shoes. Shoes must be closed  toe.  4. Please bring all current prescription medications.  We will order CT coronary calcium  score. It will cost $99.00 and is due at time of scan.  Please call to schedule.    [facility] at [facility] [address] [place], [place] [address] [phone]  [facility] [address] [place], [place] [address] [phone]      Follow-Up: At [facility], you and your health needs are our priority.  As part of our continuing mission to provide you with exceptional heart care, our providers are all part of one team.  This team includes your primary Cardiologist (physician) and Advanced Practice Providers or APPs (Physician Assistants and Nurse Practitioners) who all work together to provide you with the care you need, when you need it.  Your next appointment:   3 month(s)  Provider:   Alean Kobus, MD    We recommend signing up for the patient portal called MyChart.  Sign up information is provided on this After Visit Summary.  MyChart is used to connect with patients for Virtual Visits (Telemedicine).  Patients are able to view lab/test results, encounter notes, upcoming appointments, etc.  Non-urgent messages can be sent to your provider as well.   To learn more about what you can do with MyChart, go to [email or web address].   Other Instructions None

## 2024-09-09 ENCOUNTER — Telehealth (HOSPITAL_BASED_OUTPATIENT_CLINIC_OR_DEPARTMENT_OTHER): Payer: Self-pay

## 2024-09-19 ENCOUNTER — Telehealth (HOSPITAL_COMMUNITY): Payer: Self-pay | Admitting: *Deleted

## 2024-09-19 NOTE — Telephone Encounter (Signed)
 ETT instructions left on pt's vm.

## 2024-09-26 ENCOUNTER — Ambulatory Visit (HOSPITAL_COMMUNITY)
Admission: RE | Admit: 2024-09-26 | Discharge: 2024-09-26 | Disposition: A | Discharge status: Home / Self Care | Source: Ambulatory Visit | Attending: Cardiology | Admitting: Cardiology

## 2024-09-26 DIAGNOSIS — R9431 Abnormal electrocardiogram [ECG] [EKG]: Secondary | ICD-10-CM | POA: Insufficient documentation

## 2024-09-26 LAB — EXERCISE TOLERANCE TEST
Angina Index: 0
Estimated workload: 7
Exercise duration (min): 5 min
Exercise duration (sec): 30 s
MPHR: 151 {beats}/min
Peak HR: 136 {beats}/min
Percent HR: 90 %
Rest HR: 69 {beats}/min

## 2024-10-04 ENCOUNTER — Ambulatory Visit (HOSPITAL_COMMUNITY)
Admission: RE | Admit: 2024-10-04 | Discharge: 2024-10-04 | Disposition: A | Discharge status: Home / Self Care | Source: Ambulatory Visit

## 2024-10-04 DIAGNOSIS — R9431 Abnormal electrocardiogram [ECG] [EKG]: Secondary | ICD-10-CM | POA: Insufficient documentation

## 2024-10-04 LAB — ECHOCARDIOGRAM COMPLETE
AR max vel: 1.66 cm2
AV Area VTI: 1.61 cm2
AV Area mean vel: 1.58 cm2
AV Mean grad: 4 mmHg
AV Peak grad: 6.8 mmHg
Ao pk vel: 1.3 m/s
Area-P 1/2: 3.83 cm2
MV M vel: 2.61 m/s
MV Peak grad: 27.2 mmHg
S' Lateral: 2.8 cm

## 2024-10-08 ENCOUNTER — Ambulatory Visit: Payer: Self-pay

## 2024-10-08 DIAGNOSIS — I340 Nonrheumatic mitral (valve) insufficiency: Secondary | ICD-10-CM | POA: Insufficient documentation

## 2024-10-08 DIAGNOSIS — I25119 Atherosclerotic heart disease of native coronary artery with unspecified angina pectoris: Secondary | ICD-10-CM

## 2024-10-14 MED ORDER — METOPROLOL TARTRATE 50 MG PO TABS: ORAL_TABLET | ORAL | 0 refills | Status: DC

## 2024-11-01 ENCOUNTER — Ambulatory Visit

## 2024-11-01 VITALS — BP 110/70 | HR 62 | Ht 65.0 in | Wt 190.6 lb

## 2024-11-01 DIAGNOSIS — Z0000 Encounter for general adult medical examination without abnormal findings: Secondary | ICD-10-CM

## 2024-11-01 NOTE — Patient Instructions (Addendum)
 [name],  Thank you for taking the time for your Medicare Wellness Visit. I appreciate your continued commitment to your health goals. Please review the care plan we discussed, and feel free to reach out if I can assist you further.  Please note that Annual Wellness Visits do not include a physical exam. Some assessments may be limited, especially if the visit was conducted virtually. If needed, we may recommend an in-person follow-up with your provider.  Ongoing Care Seeing your primary care provider every 3 to 6 months helps us  monitor your health and provide consistent, personalized care. Last office visit on 08/26/2024.  You are due for a 2nd Shingrix  vaccine and can get that done at any pharmacy.  Keep up the good work.    Referrals If a referral was made during today's visit and you haven't received any updates within two weeks, please contact the referred provider directly to check on the status.  Recommended Screenings:  Health Maintenance  Topic Date Due   Medicare Annual Wellness Visit  Never done   Zoster (Shingles) Vaccine (2 of 2) 10/14/2019   COVID-19 Vaccine (3 - Moderna risk series) 02/08/2020   Breast Cancer Screening  12/05/2025   DTaP/Tdap/Td vaccine (3 - Td or Tdap) 06/16/2032   Colon Cancer Screening  04/10/2034   Pneumococcal Vaccine for age over 69  Completed   Flu Shot  Completed   Osteoporosis screening with Bone Density Scan  Completed   Hepatitis C Screening  Completed   Meningitis B Vaccine  Aged Out       11/01/2024   11:13 AM  Advanced Directives  Does Patient Have a Medical Advance Directive? Yes  Type of Advance Directive Healthcare Power of Attorney  Copy of Healthcare Power of Attorney in Chart? No - copy requested    Vision: Annual vision screenings are recommended for early detection of glaucoma, cataracts, and diabetic retinopathy. These exams can also reveal signs of chronic conditions such as diabetes and high blood pressure.  Dental:  Annual dental screenings help detect early signs of oral cancer, gum disease, and other conditions linked to overall health, including heart disease and diabetes.  Please see the attached documents for additional preventive care recommendations.

## 2024-11-01 NOTE — Progress Notes (Signed)
 "   Chief Complaint  Patient presents with   Medicare Wellness     Subjective:   Toni Ramirez is a 68 y.o. female who presents for a Medicare Annual Wellness Visit.  Visit info / Clinical Intake: Medicare Wellness Visit Type:: Initial Annual Wellness Visit Persons participating in visit and providing information:: patient Medicare Wellness Visit Mode:: In-person (required for WTM) Interpreter Needed?: No Pre-visit prep was completed: yes AWV questionnaire completed by patient prior to visit?: no Living arrangements:: (!) lives alone Patient's Overall Health Status Rating: good Typical amount of pain: none Does pain affect daily life?: no Are you currently prescribed opioids?: no  Dietary Habits and Nutritional Risks How many meals a day?: 2 Eats fruit and vegetables daily?: yes Most meals are obtained by: preparing own meals; eating out (50/50) In the last 2 weeks, have you had any of the following?: none Diabetic:: no  Functional Status Activities of Daily Living (to include ambulation/medication): Independent Ambulation: Independent Medication Administration: Independent Home Management (perform basic housework or laundry): Independent Manage your own finances?: yes Primary transportation is: driving Concerns about vision?: no *vision screening is required for WTM* Concerns about hearing?: no  Fall Screening Falls in the past year?: 0 Number of falls in past year: 0 Was there an injury with Fall?: 0 Fall Risk Category Calculator: 0 Patient Fall Risk Level: Low Fall Risk  Fall Risk Patient at Risk for Falls Due to: No Fall Risks Fall risk Follow up: Falls evaluation completed; Falls prevention discussed  Home and Transportation Safety: All rugs have non-skid backing?: N/A, no rugs All stairs or steps have railings?: yes Grab bars in the bathtub or shower?: yes Have non-skid surface in bathtub or shower?: yes Good home lighting?: yes Regular seat belt use?:  yes Hospital stays in the last year:: no  Cognitive Assessment Difficulty concentrating, remembering, or making decisions? : no Will 6CIT or Mini Cog be Completed: no 6CIT or Mini Cog Declined: patient alert, oriented, able to answer questions appropriately and recall recent events  Advance Directives (For Healthcare) Does Patient Have a Medical Advance Directive?: Yes Type of Advance Directive: Healthcare Power of Attorney Copy of Healthcare Power of Attorney in Chart?: No - copy requested  Reviewed/Updated  Reviewed/Updated: Reviewed All (Medical, Surgical, Family, Medications, Allergies, Care Teams, Patient Goals)    Allergies (verified) Patient has no known allergies.   Current Medications (verified) Outpatient Encounter Medications as of 11/01/2024  Medication Sig   Cholecalciferol 50 MCG (2000 UT) TABS 1 tablet Orally Once a day; Duration: 30 day(s)   levothyroxine  (SYNTHROID ) 100 MCG tablet Take 100 mcg by mouth daily before breakfast.   Magnesium  250 MG TABS 1 tablet with a meal Orally 3 times a day   metoprolol  tartrate (LOPRESSOR ) 50 MG tablet Take 50 mg Metoprolol  2 hours prior to your procedure. (Patient not taking: Reported on 11/01/2024)   No facility-administered encounter medications on file as of 11/01/2024.    History: Past Medical History:  Diagnosis Date   Acquired hypothyroidism 02/27/2017   Allergic rhinitis 03/22/2017   Anginal pain    because of anxiety- none significant episode   Anxiety    Arthritis    Cancer (HCC)    thyroid  cancer dx. 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   Flu vaccine need 08/21/2023   Gastroesophageal  reflux disease without esophagitis 02/19/2024   Hyperlipidemia with target LDL less than 130 06/16/2022   The 10-year  ASCVD risk score (Arnett DK, et al., 2019) is: 8.9%    Values used to calculate the score:      Age: 33 years      Clincally relevant sex: Female      Is Non-Hispanic African American: No      Diabetic: No      Tobacco smoker: No      Systolic Blood Pressure: 126 mmHg      Is BP treated: No      HDL Cholesterol: 62.7 mg/dL      Total Cholesterol: 267 mg/dL       Need for prophylactic vaccination and inoculation against varicella 08/26/2024   Need for vaccination 08/21/2023   Prolonged Q-T interval on ECG 08/26/2024   Screening mammogram for breast cancer 08/21/2023   Thyroid  cancer (HCC) 12/25/2013   Thyroid  disease    Urine frequency 08/26/2024   Past Surgical History:  Procedure Laterality Date   DILATION AND CURETTAGE OF UTERUS  1994, 2003   THYROID  LOBECTOMY Right 12/20/2013   Procedure: RIGHT THYROID  LOBECTOMY;  Surgeon: Alm VEAR Angle, MD;  Location: [facility];  Service: General;  Laterality: Right;   THYROIDECTOMY N/A 12/25/2013   Procedure: COMPLETION THYROIDECTOMY;  Surgeon: Alm VEAR Angle, MD;  Location: [facility];  Service: General;  Laterality: N/A;   TONSILLECTOMY     Family History  Problem Relation Age of Onset   Breast cancer Mother    Cancer Mother        uterine, breast   Cancer Father    ADD / ADHD Son    Alcohol  abuse Neg Hx    Depression Neg Hx    Diabetes Neg Hx    Arthritis Neg Hx    Heart disease Neg Hx    Hyperlipidemia Neg Hx    Hypertension Neg Hx    Kidney disease Neg Hx    Stroke Neg Hx    Social History   Occupational History   Occupation: RETIRED  Tobacco Use   Smoking status: Never   Smokeless tobacco: Never  Vaping Use   Vaping status: Never Used  Substance and Sexual Activity   Alcohol  use: Yes    Comment: social- less 1 per week   Drug use: No   Sexual activity: Not Currently   Tobacco Counseling Counseling given: Not Answered  SDOH Screenings   Food Insecurity: No Food Insecurity (11/01/2024)  Housing: Unknown (11/01/2024)   Transportation Needs: No Transportation Needs (11/01/2024)  Utilities: Not At Risk (11/01/2024)  Depression (PHQ2-9): Low Risk (11/01/2024)  Physical Activity: Sufficiently Active (11/01/2024)  Social Connections: Moderately Isolated (11/01/2024)  Stress: No Stress Concern Present (11/01/2024)  Tobacco Use: Low Risk (11/01/2024)  Health Literacy: Adequate Health Literacy (11/01/2024)   See flowsheets for full screening details  Depression Screen PHQ 2 & 9 Depression Scale- Over the past 2 weeks, how often have you been bothered by any of the following problems? Little interest or pleasure in doing things: 0 Feeling down, depressed, or hopeless (PHQ Adolescent also includes...irritable): 0 PHQ-2 Total Score: 0 Trouble falling or staying asleep, or sleeping too much: 0 Feeling tired or having little energy: 0 Poor appetite or overeating (PHQ Adolescent also includes...weight loss): 0 Feeling bad about yourself - or that you are a failure or have let yourself or your family down: 0 Trouble concentrating on things, such as reading the newspaper or watching  television (PHQ Adolescent also includes...like school work): 1 Moving or speaking so slowly that other people could have noticed. Or the opposite - being so fidgety or restless that you have been moving around a lot more than usual: 0 Thoughts that you would be better off dead, or of hurting yourself in some way: 0 PHQ-9 Total Score: 1 If you checked off any problems, how difficult have these problems made it for you to do your work, take care of things at home, or get along with other people?: Not difficult at all  Depression Treatment Depression Interventions/Treatment : EYV7-0 Score <4 Follow-up Not Indicated     Goals Addressed               This Visit's Progress     Patient Stated (pt-stated)        Would like to work on her weight/2025             Objective:    Today's Vitals   11/01/24 1107  BP: 110/70   Pulse: 62  SpO2: 98%  Weight: 190 lb 9.6 oz (86.5 kg)  Height: 5' 5 (1.651 m)   Body mass index is 31.72 kg/m.  Hearing/Vision screen Hearing Screening - Comments:: Denies hearing difficulties   Vision Screening - Comments:: Denies vision issues. /Not UTD/no provider Immunizations and Health Maintenance Health Maintenance  Topic Date Due   Zoster Vaccines- Shingrix  (2 of 2) 10/14/2019   COVID-19 Vaccine (3 - Moderna risk series) 02/08/2020   Medicare Annual Wellness (AWV)  11/01/2025   Mammogram  12/05/2025   DTaP/Tdap/Td (3 - Td or Tdap) 06/16/2032   Colonoscopy  04/10/2034   Pneumococcal Vaccine: 50+ Years  Completed   Influenza Vaccine  Completed   Bone Density Scan  Completed   Hepatitis C Screening  Completed   Meningococcal B Vaccine  Aged Out        Assessment/Plan:  This is a routine wellness examination for Toni Ramirez.  Patient Care Team: Joshua Debby CROME, MD as PCP - General (Internal Medicine) Barbette Knock, MD as Attending Physician (Obstetrics and Gynecology) Rosan Credit, MD (Ophthalmology)  I have personally reviewed and noted the following in the patients chart:   Medical and social history Use of alcohol , tobacco or illicit drugs  Current medications and supplements including opioid prescriptions. Functional ability and status Nutritional status Physical activity Advanced directives List of other physicians Hospitalizations, surgeries, and ER visits in previous 12 months Vitals Screenings to include cognitive, depression, and falls Referrals and appointments  No orders of the defined types were placed in this encounter.  In addition, I have reviewed and discussed with patient certain preventive protocols, quality metrics, and best practice recommendations. A written personalized care plan for preventive services as well as general preventive health recommendations were provided to patient.   Toni Ramirez, CMA   11/01/2024   Return in  1 year (on 11/01/2025).  After Visit Summary: (MyChart) Due to this being a telephonic visit, the after visit summary with patients personalized plan was offered to patient via MyChart   Nurse Notes: Patient is due for a 2nd shingrix  vaccine.  She had no other concerns to address today. "

## 2024-11-13 ENCOUNTER — Encounter (HOSPITAL_COMMUNITY): Payer: Self-pay

## 2024-11-18 ENCOUNTER — Ambulatory Visit (HOSPITAL_COMMUNITY)
Admission: RE | Admit: 2024-11-18 | Discharge: 2024-11-18 | Disposition: A | Discharge status: Home / Self Care | Source: Ambulatory Visit

## 2024-11-18 DIAGNOSIS — I25119 Atherosclerotic heart disease of native coronary artery with unspecified angina pectoris: Secondary | ICD-10-CM | POA: Insufficient documentation

## 2024-11-18 DIAGNOSIS — I340 Nonrheumatic mitral (valve) insufficiency: Secondary | ICD-10-CM | POA: Insufficient documentation

## 2024-11-18 MED ORDER — NITROGLYCERIN 0.4 MG SL SUBL
0.8000 mg | SUBLINGUAL_TABLET | Freq: Once | SUBLINGUAL | Status: AC
  Administered 2024-11-18: 0.8 mg via SUBLINGUAL

## 2024-11-18 MED ORDER — IOHEXOL 350 MG/ML SOLN
100.0000 mL | Freq: Once | INTRAVENOUS | Status: AC | PRN
  Administered 2024-11-18: 100 mL via INTRAVENOUS

## 2024-12-02 ENCOUNTER — Ambulatory Visit: Payer: Self-pay

## 2024-12-02 NOTE — Telephone Encounter (Signed)
 FYI Only or Action Required?: Action required by provider: request for appointment and refused ED/ Urgent Care.  High risk for allergenic/infectious/toxigenic mold exposure due to history of water damage in home.  Patient was last seen in primary care on 08/26/2024 by Joshua Debby CROME, MD.  Called Nurse Triage reporting Wheezing.  Symptoms began several months ago.  Interventions attempted: Rest, hydration, or home remedies.  Symptoms are: gradually worsening.  Triage Disposition: See HCP Within 4 Hours (Or PCP Triage)  Patient/caregiver understands and will follow disposition?: No, wishes to speak with PCP      Message from [place] R sent at 12/02/2024  5:20 PM EST  Wheezing, lack of energy, coughing, general feeling of unwell   Reason for Disposition  [1] Longstanding difficulty breathing (e.g., CHF, COPD, emphysema) AND [2] WORSE than normal    Is not having difficulty breathing, however, wheezes have developed intermittently with longstanding cough.  Advised ED or Urgent Care.  Answer Assessment - Initial Assessment Questions EXPOSURES: Denies any known exposure to fumes in home.   Denies any visible mold growth seen, however, did have water issue several years ago with high water bill for approximately one month.  [employer] came out to fix a water back up which affected two bathrooms.  Patient has not done any dust testing of home or HVAC for possible mold contamination from this event.  Patient has not had any mycotoxin testing (serum, urine) of body done.  Patient had thyroid  cancer treated 11 years ago, no current treatments.    1. RESPIRATORY STATUS: Describe your breathing? (e.g., wheezing, shortness of breath, unable to speak, severe coughing)      Wheezing- sounds like floor is creaking; intermittent.  Noticed more last night when laying down, every breath was wheezy then.   Very mild wheeze with cough heard during call, audile to Triage RN approximately 2x.    2.  ONSET: When did this breathing problem begin?      Seems like had a cold in October/ November, cough since then.  Might go all day and hardly no cough.   Appt with Dr. Joshua, mentioned it to him.  Cardiology all cleared per patient. Reports some heart palpations in past- states ECHO, stress test, dye test was all normal per patient.  Notices palpitations now since becoming aware.   3. PATTERN Does the difficult breathing come and go, or has it been constant since it started?      Cough intermittent all day. Wheezing is increasing recently.   4. SEVERITY: How bad is your breathing? (e.g., mild, moderate, severe)      Denies any trouble breathing, no chest pain.    5. RECURRENT SYMPTOM: Have you had difficulty breathing before? If Yes, ask: When was the last time? and What happened that time?      Denies any difficulty breathing.   6. CARDIAC HISTORY: Do you have any history of heart disease? (e.g., heart attack, angina, bypass surgery, angioplasty)      Reports all tests were negative recently done.   7. LUNG HISTORY: Do you have any history of lung disease?  (e.g., pulmonary embolus, asthma, emphysema)     Denies any asthma or recent prolonged travel.   8. CAUSE: What do you think is causing the breathing problem?      Relates onset to a cold back in October/ November.  Cough has not gone away, now developing intermittent wheezing and has had burning sensation in throat, was told possibly is reflux.  9. OTHER SYMPTOMS: Do you have any other symptoms? (e.g., chest pain, cough, dizziness, fever, runny nose)     Denies fever/ chills. When taking deep breath then has to cough, feels like hot liquid later described as possible reflux. Denies congestion or postnasal drip.   10. O2 SATURATION MONITOR:  Do you use an oxygen saturation monitor (pulse oximeter) at home? If Yes, ask: What is your reading (oxygen level) today? What is your usual oxygen saturation reading?  (e.g., 95%)       No oximeter available, reports recently saw OB/GYN and vitals were normal.   11. TRAVEL: Have you traveled out of the country in the last month? (e.g., travel history, exposures)       No recent sick exposures patient is aware of.  Protocols used: Breathing Difficulty-A-AH

## 2024-12-03 NOTE — Telephone Encounter (Signed)
 Unable to reach patient. LMTRC

## 2024-12-06 ENCOUNTER — Ambulatory Visit

## 2024-12-12 ENCOUNTER — Ambulatory Visit

## 2024-12-12 VITALS — BP 126/64 | HR 66 | Ht 65.0 in | Wt 189.0 lb

## 2024-12-12 DIAGNOSIS — R9431 Abnormal electrocardiogram [ECG] [EKG]: Secondary | ICD-10-CM | POA: Diagnosis not present

## 2024-12-12 DIAGNOSIS — E785 Hyperlipidemia, unspecified: Secondary | ICD-10-CM

## 2024-12-12 DIAGNOSIS — I340 Nonrheumatic mitral (valve) insufficiency: Secondary | ICD-10-CM

## 2024-12-12 DIAGNOSIS — Q245 Malformation of coronary vessels: Secondary | ICD-10-CM

## 2024-12-12 NOTE — Assessment & Plan Note (Signed)
 Calcium  score 0. No significant atherosclerotic burden on cardiac CT 11/18/2024. Continue with aggressive dietary and lifestyle modification. If no significant improvement in LDL, would recommend statins to target LDL below 100 mg/dL.  Lipid Panel     Component Value Date/Time   CHOL 267 (H) 08/26/2024 1359   TRIG 242.0 (H) 08/26/2024 1359   HDL 62.70 08/26/2024 1359   CHOLHDL 4 08/26/2024 1359   VLDL 48.4 (H) 08/26/2024 1359   LDLCALC 156 (H) 08/26/2024 1359   LDLDIRECT 135.0 06/17/2022 1354

## 2024-12-12 NOTE — Progress Notes (Signed)
 "  Cardiology Consultation:    Date:  12/12/2024   ID:  Toni, Ramirez [birth year], MRN [id number]  PCP:  Joshua Debby CROME, MD  Cardiologist:  Alean SAUNDERS Lorelie Biermann, MD   Referring MD: Joshua Debby CROME, MD   No chief complaint on file.    ASSESSMENT AND PLAN:   Toni Ramirez 69 year old woman history of hypothyroidism [on Synthyroid for replacement], obesity, hyperlipidemia, takes calcium  plus vitamin D  and magnesium  supplementations.  Occasional social alcohol  consumption.  Was evaluated for borderline prolonged QTc on referral from PCP. 10-year ASCVD  risk score 8.9%. Further evaluated with treadmill exercise stress test 09/26/2024 which was abnormal with horizontal ST depression in lead V4 through V6, was reported as equivocal and suggested coronary CT.  Attained 7 METS, 90% MPHR.  Normal blood pressure and heart rate response. Echocardiogram 10/04/2024 noted normal biventricular function with LVEF 60 to 65%, mild MR. Cardiac CT coronary angiogram completed 11/18/2024 noted calcium  score 0, CAD RADS 0 study.  Small area of mid LAD myocardial bridge observed.  No significant extracardiac findings.  Here for follow-up and review test results.  Problem List Items Addressed This Visit       Cardiovascular and Mediastinum   Mitral regurgitation   Reassured about the findings.   Reassess with repeat echocardiogram tentatively in 2 years.      Coronary-myocardial bridge - Primary   Small area of mid LAD myocardial bridge observed on cardiac CT imaging from 11/18/2024. Otherwise no significant coronary atherosclerotic burden.  Exercise stress test 09/26/2024 with below average exercise capacity 5 minutes 30 seconds on treadmill, 90% MPHR attained, 7 METS, mild horizontal ST depression in leads V4 through V6.  Prompted further evaluation with cardiac CT as above.  EKG verbal findings.   No marked ischemic changes, no arrhythmias. Reassured about the findings.          Other    Hyperlipidemia with target LDL less than 130   Calcium  score 0. No significant atherosclerotic burden on cardiac CT 11/18/2024. Continue with aggressive dietary and lifestyle modification. If no significant improvement in LDL, would recommend statins to target LDL below 100 mg/dL.  Lipid Panel     Component Value Date/Time   CHOL 267 (H) 08/26/2024 1359   TRIG 242.0 (H) 08/26/2024 1359   HDL 62.70 08/26/2024 1359   CHOLHDL 4 08/26/2024 1359   VLDL 48.4 (H) 08/26/2024 1359   LDLCALC 156 (H) 08/26/2024 1359   LDLDIRECT 135.0 06/17/2022 1354         Prolonged Q-T interval on ECG   PCP referred for prolonged QT, review of EKG at PCPs office was reassuring. Borderline QT interval on initial consult with me in the office QTc 485 ms.  No significant structural and functional abnormalities on echocardiogram and cardiac CT other than the ones reviewed above.  No significant therapy needed at this time. To be mindful about any medication use with potential for QT prolongation.     Return to clinic for follow-up with us  on an as-needed basis    History of Present Illness:    Toni Ramirez is a 69 y.o. female who is being seen today for for follow-up visit. PCP is Joshua Debby CROME, MD. Last visit with me in the office was 09/04/2024.  Pleasant woman here for the visit today by herself.  Has history of hypothyroidism [on Synthyroid for replacement], obesity, hyperlipidemia, takes calcium  plus vitamin D  and magnesium  supplementations.  Occasional social alcohol  consumption.  Was evaluated for  borderline prolonged QTc on referral from PCP. 10-year ASCVD  risk score 8.9%. Further evaluated with treadmill exercise stress test 09/26/2024 which was abnormal with horizontal ST depression in lead V4 through V6, was reported as equivocal and suggested coronary CT.  Attained 7 METS, 90% MPHR.  Normal blood pressure and heart rate response. Echocardiogram 10/04/2024 noted normal biventricular  function with LVEF 60 to 65%, mild MR. Cardiac CT coronary angiogram completed 11/18/2024 noted calcium  score 0, CAD RADS 0 study.  Small area of mid LAD myocardial bridge observed.  No significant extracardiac findings.  Mentions overall she has been doing well. No further active symptoms. Feels she would have gone longer on the treadmill stress test hide she anticipated water intake goals.  She was already tired that day having exercised and worked through several errands.  Reviewed results with her extensively today. She has been modifying her diet and exercising regularly.    Past Medical History:  Diagnosis Date   Acquired hypothyroidism 02/27/2017   Allergic rhinitis 03/22/2017   Anginal pain    because of anxiety- none significant episode   Anxiety    Arthritis    Cancer (HCC)    thyroid  cancer dx. 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   Flu vaccine need 08/21/2023   Gastroesophageal reflux disease without esophagitis 02/19/2024   Hyperlipidemia with target LDL less than 130 06/16/2022   The 10-year ASCVD risk score (Arnett DK, et al., 2019) is: 8.9%    Values used to calculate the score:      Age: 51 years      Clincally relevant sex: Female      Is Non-Hispanic African American: No      Diabetic: No      Tobacco smoker: No      Systolic Blood Pressure: 126 mmHg      Is BP treated: No      HDL Cholesterol: 62.7 mg/dL      Total Cholesterol: 267 mg/dL       Need for prophylactic vaccination and inoculation against varicella 08/26/2024   Need for vaccination 08/21/2023   Prolonged Q-T interval on ECG 08/26/2024   Screening mammogram for breast cancer 08/21/2023   Thyroid  cancer (HCC) 12/25/2013   Thyroid  disease    Urine frequency 08/26/2024    Past Surgical History:   Procedure Laterality Date   DILATION AND CURETTAGE OF UTERUS  1994, 2003   THYROID  LOBECTOMY Right 12/20/2013   Procedure: RIGHT THYROID  LOBECTOMY;  Surgeon: Alm VEAR Angle, MD;  Location: [facility];  Service: General;  Laterality: Right;   THYROIDECTOMY N/A 12/25/2013   Procedure: COMPLETION THYROIDECTOMY;  Surgeon: Alm VEAR Angle, MD;  Location: [facility];  Service: General;  Laterality: N/A;   TONSILLECTOMY      Current Medications: Active Medications[1]   Allergies:   Cefixime   Social History   Socioeconomic History   Marital status: Widowed    Spouse name: Not on file   Number of children: Not on file   Years of education: Not on file   Highest education level: Not on file  Occupational History   Occupation: RETIRED  Tobacco Use   Smoking status: Never   Smokeless tobacco: Never  Vaping Use   Vaping status: Never Used  Substance and Sexual Activity   Alcohol  use: Yes  Comment: social- less 1 per week   Drug use: No   Sexual activity: Not Currently  Other Topics Concern   Not on file  Social History Narrative   Lives alone/2025   Social Drivers of Health   Tobacco Use: Low Risk (12/12/2024)   Patient History    Smoking Tobacco Use: Never    Smokeless Tobacco Use: Never    Passive Exposure: Not on file  Financial Resource Strain: Not on file  Food Insecurity: Low Risk (12/10/2024)   Received from [facility]   Epic    Within the past 12 months, you worried that your food would run out before you got money to buy more: Never true    Within the past 12 months, the food you bought just didn't last and you didn't have money to get more. : Never true  Transportation Needs: No Transportation Needs (12/10/2024)   Received from [facility]    In the past 12 months, has lack of reliable transportation kept you from medical appointments, meetings, work or from getting things needed for daily living? : No  Physical Activity: Sufficiently Active  (11/01/2024)   Exercise Vital Sign    Days of Exercise per Week: 5 days    Minutes of Exercise per Session: 60 min  Stress: No Stress Concern Present (11/01/2024)   [employer] of Occupational Health - Occupational Stress Questionnaire    Feeling of Stress: Only a little  Social Connections: Moderately Isolated (11/01/2024)   Social Connection and Isolation Panel    Frequency of Communication with Friends and Family: Once a week    Frequency of Social Gatherings with Friends and Family: More than three times a week    Attends Religious Services: Never    Database Administrator or Organizations: Yes    Attends Banker Meetings: 1 to 4 times per year    Marital Status: Widowed  Depression (PHQ2-9): Low Risk (11/01/2024)   Depression (PHQ2-9)    PHQ-2 Score: 1  Alcohol  Screen: Not on file  Housing: Low Risk (12/10/2024)   Received from [facility]   Epic    What is your living situation today?: I have a steady place to live    Think about the place you live. Do you have problems with any of the following? Choose all that apply:: None/None on this list  Utilities: Low Risk (12/10/2024)   Received from [facility]   Utilities    In the past 12 months has the electric, gas, oil, or water company threatened to shut off services in your home? : No  Health Literacy: Adequate Health Literacy (11/01/2024)   B1300 Health Literacy    Frequency of need for help with medical instructions: Never     Family History: The patient's family history includes ADD / ADHD in her son; Breast cancer in her mother; Cancer in her father and mother. There is no history of Alcohol  abuse, Depression, Diabetes, Arthritis, Heart disease, Hyperlipidemia, Hypertension, Kidney disease, or Stroke. ROS:   Please see the history of present illness.    All 14 point review of systems negative except as described per history of present illness.  EKGs/Labs/Other Studies Reviewed:    The following  studies were reviewed today:   EKG:       Recent Labs: 08/26/2024: ALT 12; BUN 16; Creatinine, Ser 0.90; Hemoglobin 13.5; Magnesium  2.0; Platelets 377.0; Potassium 3.8; Sodium 139; TSH 2.54  Recent Lipid Panel    Component  Value Date/Time   CHOL 267 (H) 08/26/2024 1359   TRIG 242.0 (H) 08/26/2024 1359   HDL 62.70 08/26/2024 1359   CHOLHDL 4 08/26/2024 1359   VLDL 48.4 (H) 08/26/2024 1359   LDLCALC 156 (H) 08/26/2024 1359   LDLDIRECT 135.0 06/17/2022 1354    Physical Exam:    VS:  BP 126/64   Pulse 66   Ht 5' 5 (1.651 m)   Wt 189 lb (85.7 kg)   LMP 06/14/2010   SpO2 98%   BMI 31.45 kg/m     Wt Readings from Last 3 Encounters:  12/12/24 189 lb (85.7 kg)  11/01/24 190 lb 9.6 oz (86.5 kg)  09/04/24 185 lb (83.9 kg)     GENERAL:  Well nourished, well developed in no acute distress NECK: No JVD; No carotid bruits CARDIAC: RRR, S1 and S2 present, no murmurs, no rubs, no gallops CHEST:  Clear to auscultation without rales, wheezing or rhonchi  Extremities: No pitting pedal edema. Pulses bilaterally symmetric with radial 2+ and dorsalis pedis 2+ NEUROLOGIC:  Alert and oriented x 3  Medication Adjustments/Labs and Tests Ordered: Current medicines are reviewed at length with the patient today.  Concerns regarding medicines are outlined above.  No orders of the defined types were placed in this encounter.  No orders of the defined types were placed in this encounter.   Signed, Orpha Dain reddy Srah Ake, MD, MPH, [facility]. 12/12/2024 5:02 PM    [facility]     [1]  Current Meds  Medication Sig   Cholecalciferol 50 MCG (2000 UT) TABS 1 tablet Orally Once a day; Duration: 30 day(s)   Emollient (COLLAGEN) CREA Apply 1 Application topically daily at 6 (six) AM.   levothyroxine  (SYNTHROID ) 100 MCG tablet Take 100 mcg by mouth daily before breakfast.   Magnesium  250 MG TABS 1 tablet with a meal Orally 3 times a day   "

## 2024-12-12 NOTE — Patient Instructions (Signed)
 Medication Instructions:  Your physician recommends that you continue on your current medications as directed. Please refer to the Current Medication list given to you today.  *If you need a refill on your cardiac medications before your next appointment, please call your pharmacy*  Lab Work: NONE If you have labs (blood work) drawn today and your tests are completely normal, you will receive your results only by: MyChart Message (if you have MyChart) OR A paper copy in the mail If you have any lab test that is abnormal or we need to change your treatment, we will call you to review the results.  Testing/Procedures: Echo in 2 years  Follow-Up: At [facility], you and your health needs are our priority.  As part of our continuing mission to provide you with exceptional heart care, our providers are all part of one team.  This team includes your primary Cardiologist (physician) and Advanced Practice Providers or APPs (Physician Assistants and Nurse Practitioners) who all work together to provide you with the care you need, when you need it.  Your next appointment:  Call or return to clinic prn if these symptoms worsen or fail to improve as anticipated.     Provider:   Alean Kobus, MD    We recommend signing up for the patient portal called MyChart.  Sign up information is provided on this After Visit Summary.  MyChart is used to connect with patients for Virtual Visits (Telemedicine).  Patients are able to view lab/test results, encounter notes, upcoming appointments, etc.  Non-urgent messages can be sent to your provider as well.   To learn more about what you can do with MyChart, go to [email or web address].   Other Instructions

## 2024-12-12 NOTE — Assessment & Plan Note (Signed)
 Small area of mid LAD myocardial bridge observed on cardiac CT imaging from 11/18/2024. Otherwise no significant coronary atherosclerotic burden.  Exercise stress test 09/26/2024 with below average exercise capacity 5 minutes 30 seconds on treadmill, 90% MPHR attained, 7 METS, mild horizontal ST depression in leads V4 through V6.  Prompted further evaluation with cardiac CT as above.  EKG verbal findings.   No marked ischemic changes, no arrhythmias. Reassured about the findings.

## 2024-12-12 NOTE — Assessment & Plan Note (Addendum)
 Reassured about the findings.   Reassess with repeat echocardiogram tentatively in 2 years.

## 2024-12-12 NOTE — Assessment & Plan Note (Addendum)
 PCP referred for prolonged QT, review of EKG at PCPs office was reassuring. Borderline QT interval on initial consult with me in the office QTc 485 ms.  No significant structural and functional abnormalities on echocardiogram and cardiac CT other than the ones reviewed above.  No significant therapy needed at this time. To be mindful about any medication use with potential for QT prolongation.

## 2025-01-02 ENCOUNTER — Ambulatory Visit: Admitting: Internal Medicine

## 2025-11-10 ENCOUNTER — Ambulatory Visit
